# Patient Record
Sex: Male | Born: 1945 | Race: White | Hispanic: No | Marital: Single | Smoking: Former smoker
Health system: Southern US, Community
[De-identification: ages and names within clinical notes are randomized; demographics above are authoritative.]

## PROBLEM LIST (undated history)

## (undated) DIAGNOSIS — J449 Chronic obstructive pulmonary disease, unspecified: Secondary | ICD-10-CM

## (undated) DIAGNOSIS — I10 Essential (primary) hypertension: Secondary | ICD-10-CM

## (undated) DIAGNOSIS — I509 Heart failure, unspecified: Secondary | ICD-10-CM

## (undated) HISTORY — PX: KIDNEY STONE SURGERY: SHX686

## (undated) HISTORY — DX: Heart failure, unspecified: I50.9

## (undated) HISTORY — DX: Essential (primary) hypertension: I10

## (undated) HISTORY — DX: Chronic obstructive pulmonary disease, unspecified: J44.9

## (undated) HISTORY — PX: BLADDER SURGERY: SHX569

---

## 2005-09-03 ENCOUNTER — Emergency Department: Payer: Self-pay | Admitting: Emergency Medicine

## 2010-06-21 ENCOUNTER — Ambulatory Visit: Payer: Self-pay | Admitting: Specialist

## 2010-08-18 ENCOUNTER — Inpatient Hospital Stay: Payer: Self-pay | Admitting: Internal Medicine

## 2010-08-28 ENCOUNTER — Emergency Department: Payer: Self-pay | Admitting: Emergency Medicine

## 2010-09-20 ENCOUNTER — Ambulatory Visit: Payer: Self-pay | Admitting: Internal Medicine

## 2011-01-17 ENCOUNTER — Ambulatory Visit: Payer: Self-pay | Admitting: Internal Medicine

## 2011-01-28 ENCOUNTER — Inpatient Hospital Stay: Payer: Self-pay | Admitting: Internal Medicine

## 2011-02-16 ENCOUNTER — Ambulatory Visit: Payer: Self-pay | Admitting: Internal Medicine

## 2011-02-20 ENCOUNTER — Ambulatory Visit: Payer: Self-pay | Admitting: Cardiothoracic Surgery

## 2011-02-20 DIAGNOSIS — T797XXA Traumatic subcutaneous emphysema, initial encounter: Secondary | ICD-10-CM | POA: Insufficient documentation

## 2011-03-19 ENCOUNTER — Ambulatory Visit: Payer: Self-pay | Admitting: Cardiothoracic Surgery

## 2011-05-19 ENCOUNTER — Ambulatory Visit: Payer: Self-pay | Admitting: Internal Medicine

## 2011-06-04 ENCOUNTER — Inpatient Hospital Stay: Payer: Self-pay | Admitting: Internal Medicine

## 2011-06-19 ENCOUNTER — Ambulatory Visit: Payer: Self-pay | Admitting: Internal Medicine

## 2011-08-04 ENCOUNTER — Inpatient Hospital Stay: Payer: Self-pay | Admitting: Internal Medicine

## 2011-08-20 ENCOUNTER — Emergency Department: Payer: Self-pay | Admitting: Emergency Medicine

## 2011-08-21 LAB — URINALYSIS, COMPLETE
Bilirubin,UR: NEGATIVE
Glucose,UR: NEGATIVE mg/dL (ref 0–75)
Hyaline Cast: 4
Ketone: NEGATIVE
Ph: 7 (ref 4.5–8.0)
RBC,UR: 29 /HPF (ref 0–5)
Specific Gravity: 1.013 (ref 1.003–1.030)
Squamous Epithelial: NONE SEEN
WBC UR: 347 /HPF (ref 0–5)

## 2011-08-21 LAB — COMPREHENSIVE METABOLIC PANEL
BUN: 31 mg/dL — ABNORMAL HIGH (ref 7–18)
Bilirubin,Total: 0.2 mg/dL (ref 0.2–1.0)
Calcium, Total: 8.9 mg/dL (ref 8.5–10.1)
Chloride: 98 mmol/L (ref 98–107)
Co2: 33 mmol/L — ABNORMAL HIGH (ref 21–32)
Creatinine: 1.05 mg/dL (ref 0.60–1.30)
EGFR (African American): >60
EGFR (Non-African Amer.): >60
SGOT(AST): 17 U/L (ref 15–37)
SGPT (ALT): 17 U/L

## 2011-08-21 LAB — CK TOTAL AND CKMB (NOT AT ARMC)
CK, Total: 33 U/L — ABNORMAL LOW (ref 35–232)
CK-MB: 1.1 ng/mL (ref 0.5–3.6)

## 2011-08-21 LAB — CBC WITH DIFFERENTIAL/PLATELET
Basophil %: 0.3 %
Eosinophil %: 1.5 %
HGB: 11.4 g/dL — ABNORMAL LOW (ref 13.0–18.0)
Lymphocyte %: 14.8 %
MCH: 30.1 pg (ref 26.0–34.0)
MCHC: 33.1 g/dL (ref 32.0–36.0)
MCV: 91 fL (ref 80–100)
Neutrophil %: 75.5 %
Platelet: 314 10*3/uL (ref 150–440)

## 2011-08-21 LAB — TROPONIN I: Troponin-I: <0.02 ng/mL

## 2011-11-27 LAB — CBC
HCT: 30.4 % — ABNORMAL LOW (ref 40.0–52.0)
HGB: 9.7 g/dL — ABNORMAL LOW (ref 13.0–18.0)
MCH: 28 pg (ref 26.0–34.0)
MCHC: 31.9 g/dL — ABNORMAL LOW (ref 32.0–36.0)
MCV: 88 fL (ref 80–100)
RBC: 3.46 10*6/uL — ABNORMAL LOW (ref 4.40–5.90)

## 2011-11-27 LAB — COMPREHENSIVE METABOLIC PANEL
Albumin: 2.9 g/dL — ABNORMAL LOW (ref 3.4–5.0)
BUN: 63 mg/dL — ABNORMAL HIGH (ref 7–18)
Calcium, Total: 8.8 mg/dL (ref 8.5–10.1)
Chloride: 101 mmol/L (ref 98–107)
Co2: 29 mmol/L (ref 21–32)
EGFR (African American): 24 — ABNORMAL LOW
Osmolality: 299 (ref 275–301)
Potassium: 3.7 mmol/L (ref 3.5–5.1)
SGOT(AST): 21 U/L (ref 15–37)
SGPT (ALT): 17 U/L
Sodium: 139 mmol/L (ref 136–145)

## 2011-11-27 LAB — TROPONIN I: Troponin-I: <0.02 ng/mL

## 2011-11-28 ENCOUNTER — Inpatient Hospital Stay: Payer: Self-pay | Admitting: Specialist

## 2011-11-28 LAB — BASIC METABOLIC PANEL
Anion Gap: 9 (ref 7–16)
BUN: 64 mg/dL — ABNORMAL HIGH (ref 7–18)
Co2: 29 mmol/L (ref 21–32)
Creatinine: 2.97 mg/dL — ABNORMAL HIGH (ref 0.60–1.30)
EGFR (African American): 24 — ABNORMAL LOW
Osmolality: 300 (ref 275–301)
Sodium: 140 mmol/L (ref 136–145)

## 2011-11-28 LAB — URINALYSIS, COMPLETE
Bilirubin,UR: NEGATIVE
Glucose,UR: NEGATIVE mg/dL (ref 0–75)
Ph: 5 (ref 4.5–8.0)
Protein: 30
WBC UR: 174 /HPF (ref 0–5)

## 2011-11-28 LAB — CBC WITH DIFFERENTIAL/PLATELET
Bands: 5 %
Basophil %: 0.6 %
Eosinophil #: 0 10*3/uL (ref 0.0–0.7)
Lymphocytes: 8 %
MCH: 27.3 pg (ref 26.0–34.0)
MCHC: 30.7 g/dL — ABNORMAL LOW (ref 32.0–36.0)
Monocyte %: 9 %
Monocytes: 4 %
Platelet: 261 10*3/uL (ref 150–440)
WBC: 24.4 10*3/uL — ABNORMAL HIGH (ref 3.8–10.6)

## 2011-11-29 ENCOUNTER — Ambulatory Visit: Payer: Self-pay | Admitting: Urology

## 2011-11-29 LAB — CBC WITH DIFFERENTIAL/PLATELET
Basophil #: 0 10*3/uL (ref 0.0–0.1)
Eosinophil %: 1 %
HGB: 8.5 g/dL — ABNORMAL LOW (ref 13.0–18.0)
Lymphocyte #: 1.4 10*3/uL (ref 1.0–3.6)
Lymphocyte %: 8.3 %
MCHC: 31.6 g/dL — ABNORMAL LOW (ref 32.0–36.0)
Neutrophil %: 79.5 %
Platelet: 255 10*3/uL (ref 150–440)

## 2011-11-29 LAB — BASIC METABOLIC PANEL
Chloride: 106 mmol/L (ref 98–107)
Creatinine: 2.77 mg/dL — ABNORMAL HIGH (ref 0.60–1.30)
EGFR (African American): 26 — ABNORMAL LOW
Glucose: 109 mg/dL — ABNORMAL HIGH (ref 65–99)
Potassium: 3.7 mmol/L (ref 3.5–5.1)
Sodium: 144 mmol/L (ref 136–145)

## 2011-11-29 LAB — URINE CULTURE

## 2011-11-30 LAB — BASIC METABOLIC PANEL
Anion Gap: 10 (ref 7–16)
BUN: 38 mg/dL — ABNORMAL HIGH (ref 7–18)
Chloride: 106 mmol/L (ref 98–107)
Creatinine: 1.93 mg/dL — ABNORMAL HIGH (ref 0.60–1.30)
EGFR (African American): 41 — ABNORMAL LOW
Sodium: 143 mmol/L (ref 136–145)

## 2011-11-30 LAB — CBC WITH DIFFERENTIAL/PLATELET
Basophil #: 0 10*3/uL (ref 0.0–0.1)
Basophil %: 0.3 %
Eosinophil #: 0.3 10*3/uL (ref 0.0–0.7)
Eosinophil %: 2.2 %
HGB: 8.8 g/dL — ABNORMAL LOW (ref 13.0–18.0)
Lymphocyte #: 1.5 10*3/uL (ref 1.0–3.6)
Lymphocyte %: 11.7 %
MCHC: 32.5 g/dL (ref 32.0–36.0)
MCV: 89 fL (ref 80–100)
Monocyte #: 1.3 x10 3/mm — ABNORMAL HIGH (ref 0.2–1.0)
Neutrophil #: 9.6 10*3/uL — ABNORMAL HIGH (ref 1.4–6.5)
Neutrophil %: 75.3 %
Platelet: 287 10*3/uL (ref 150–440)
RBC: 3.03 10*6/uL — ABNORMAL LOW (ref 4.40–5.90)
RDW: 14.1 % (ref 11.5–14.5)
WBC: 12.8 10*3/uL — ABNORMAL HIGH (ref 3.8–10.6)

## 2011-12-01 LAB — CBC WITH DIFFERENTIAL/PLATELET
Basophil #: 0.1 10*3/uL (ref 0.0–0.1)
Eosinophil #: 0.3 10*3/uL (ref 0.0–0.7)
Eosinophil %: 1.8 %
HGB: 9.6 g/dL — ABNORMAL LOW (ref 13.0–18.0)
Lymphocyte %: 13.1 %
MCH: 27.9 pg (ref 26.0–34.0)
MCHC: 31.6 g/dL — ABNORMAL LOW (ref 32.0–36.0)
MCV: 88 fL (ref 80–100)
Neutrophil #: 10.6 10*3/uL — ABNORMAL HIGH (ref 1.4–6.5)
Neutrophil %: 74.5 %
Platelet: 350 10*3/uL (ref 150–440)
RBC: 3.43 10*6/uL — ABNORMAL LOW (ref 4.40–5.90)
WBC: 14.3 10*3/uL — ABNORMAL HIGH (ref 3.8–10.6)

## 2011-12-01 LAB — BASIC METABOLIC PANEL
BUN: 28 mg/dL — ABNORMAL HIGH (ref 7–18)
Chloride: 105 mmol/L (ref 98–107)
Co2: 29 mmol/L (ref 21–32)
Creatinine: 1.55 mg/dL — ABNORMAL HIGH (ref 0.60–1.30)
EGFR (African American): 53 — ABNORMAL LOW
EGFR (Non-African Amer.): 46 — ABNORMAL LOW
Osmolality: 291 (ref 275–301)
Potassium: 3.8 mmol/L (ref 3.5–5.1)
Sodium: 143 mmol/L (ref 136–145)

## 2011-12-02 LAB — BASIC METABOLIC PANEL
Anion Gap: 8 (ref 7–16)
BUN: 21 mg/dL — ABNORMAL HIGH (ref 7–18)
Chloride: 105 mmol/L (ref 98–107)
EGFR (Non-African Amer.): 60 — ABNORMAL LOW
Glucose: 120 mg/dL — ABNORMAL HIGH (ref 65–99)
Osmolality: 285 (ref 275–301)
Sodium: 141 mmol/L (ref 136–145)

## 2012-01-06 ENCOUNTER — Inpatient Hospital Stay: Payer: Self-pay | Admitting: Urology

## 2012-01-06 DIAGNOSIS — I4891 Unspecified atrial fibrillation: Secondary | ICD-10-CM

## 2012-01-07 LAB — CBC WITH DIFFERENTIAL/PLATELET
Basophil %: 0.4 %
Eosinophil #: 0.4 10*3/uL (ref 0.0–0.7)
Eosinophil %: 3.2 %
HGB: 7.9 g/dL — ABNORMAL LOW (ref 13.0–18.0)
Lymphocyte %: 22.4 %
MCHC: 32.4 g/dL (ref 32.0–36.0)
MCV: 86 fL (ref 80–100)
Monocyte #: 1 x10 3/mm (ref 0.2–1.0)
Monocyte %: 8.6 %
Neutrophil #: 7.9 10*3/uL — ABNORMAL HIGH (ref 1.4–6.5)
Platelet: 465 10*3/uL — ABNORMAL HIGH (ref 150–440)
RBC: 2.82 10*6/uL — ABNORMAL LOW (ref 4.40–5.90)
RDW: 14.6 % — ABNORMAL HIGH (ref 11.5–14.5)
WBC: 12 10*3/uL — ABNORMAL HIGH (ref 3.8–10.6)

## 2012-01-07 LAB — BASIC METABOLIC PANEL
Anion Gap: 7 (ref 7–16)
BUN: 15 mg/dL (ref 7–18)
Calcium, Total: 8.2 mg/dL — ABNORMAL LOW (ref 8.5–10.1)
Chloride: 104 mmol/L (ref 98–107)
Co2: 29 mmol/L (ref 21–32)
Creatinine: 1.02 mg/dL (ref 0.60–1.30)
EGFR (Non-African Amer.): >60
Glucose: 104 mg/dL — ABNORMAL HIGH (ref 65–99)
Sodium: 140 mmol/L (ref 136–145)

## 2012-01-09 LAB — PATHOLOGY REPORT

## 2012-01-14 ENCOUNTER — Ambulatory Visit: Payer: Self-pay | Admitting: Oncology

## 2012-01-15 ENCOUNTER — Ambulatory Visit: Payer: Self-pay | Admitting: Oncology

## 2012-01-15 LAB — CBC CANCER CENTER
Basophil #: 0 x10 3/mm (ref 0.0–0.1)
Eosinophil %: 3.6 %
HCT: 28.5 % — ABNORMAL LOW (ref 40.0–52.0)
HGB: 9 g/dL — ABNORMAL LOW (ref 13.0–18.0)
Lymphocyte #: 2.5 x10 3/mm (ref 1.0–3.6)
MCV: 88 fL (ref 80–100)
Monocyte #: 1.1 x10 3/mm — ABNORMAL HIGH (ref 0.2–1.0)
Monocyte %: 10.3 %
Neutrophil #: 6.4 x10 3/mm (ref 1.4–6.5)
Neutrophil %: 61.2 %
Platelet: 498 x10 3/mm — ABNORMAL HIGH (ref 150–440)
RBC: 3.26 10*6/uL — ABNORMAL LOW (ref 4.40–5.90)
RDW: 15.7 % — ABNORMAL HIGH (ref 11.5–14.5)
WBC: 10.4 x10 3/mm (ref 3.8–10.6)

## 2012-01-15 LAB — COMPREHENSIVE METABOLIC PANEL
Albumin: 3.1 g/dL — ABNORMAL LOW (ref 3.4–5.0)
Alkaline Phosphatase: 69 U/L (ref 50–136)
Anion Gap: 5 — ABNORMAL LOW (ref 7–16)
BUN: 17 mg/dL (ref 7–18)
Bilirubin,Total: 0.1 mg/dL — ABNORMAL LOW (ref 0.2–1.0)
Calcium, Total: 8.8 mg/dL (ref 8.5–10.1)
Chloride: 100 mmol/L (ref 98–107)
EGFR (African American): >60
EGFR (Non-African Amer.): >60
Glucose: 90 mg/dL (ref 65–99)
Osmolality: 279 (ref 275–301)
SGOT(AST): 19 U/L (ref 15–37)
SGPT (ALT): 20 U/L

## 2012-01-17 ENCOUNTER — Ambulatory Visit: Payer: Self-pay | Admitting: Oncology

## 2012-01-28 LAB — CBC CANCER CENTER
Basophil %: 0.5 %
Eosinophil: 4 %
HCT: 32.5 % — ABNORMAL LOW (ref 40.0–52.0)
HGB: 10.2 g/dL — ABNORMAL LOW (ref 13.0–18.0)
Lymphocyte %: 29.7 %
Monocyte #: 0.9 x10 3/mm (ref 0.2–1.0)
Monocyte %: 9.8 %
Neutrophil #: 4.8 x10 3/mm (ref 1.4–6.5)
Neutrophil %: 55.8 %
Platelet: 320 x10 3/mm (ref 150–440)
RBC: 3.63 10*6/uL — ABNORMAL LOW (ref 4.40–5.90)

## 2012-02-11 ENCOUNTER — Ambulatory Visit: Payer: Self-pay | Admitting: Oncology

## 2012-02-16 ENCOUNTER — Ambulatory Visit: Payer: Self-pay | Admitting: Oncology

## 2012-03-10 ENCOUNTER — Ambulatory Visit: Payer: Self-pay | Admitting: Gastroenterology

## 2012-03-12 LAB — PATHOLOGY REPORT

## 2012-03-15 LAB — CBC CANCER CENTER
Basophil %: 0.3 %
Eosinophil %: 2.8 %
HCT: 37 % — ABNORMAL LOW (ref 40.0–52.0)
HGB: 12.2 g/dL — ABNORMAL LOW (ref 13.0–18.0)
Lymphocyte %: 27.1 %
Monocyte %: 11 %
Neutrophil %: 58.8 %
Platelet: 286 x10 3/mm (ref 150–440)
RBC: 4.14 10*6/uL — ABNORMAL LOW (ref 4.40–5.90)
WBC: 7 x10 3/mm (ref 3.8–10.6)

## 2012-03-15 LAB — PROTIME-INR: Prothrombin Time: 12.7 secs (ref 11.5–14.7)

## 2012-03-16 ENCOUNTER — Ambulatory Visit: Payer: Self-pay | Admitting: Oncology

## 2012-03-18 ENCOUNTER — Ambulatory Visit: Payer: Self-pay | Admitting: Oncology

## 2012-04-18 ENCOUNTER — Ambulatory Visit: Payer: Self-pay | Admitting: Oncology

## 2012-04-22 ENCOUNTER — Ambulatory Visit: Payer: Self-pay | Admitting: Urology

## 2012-04-22 LAB — BASIC METABOLIC PANEL
Anion Gap: 1 — ABNORMAL LOW (ref 7–16)
BUN: 22 mg/dL — ABNORMAL HIGH (ref 7–18)
Chloride: 103 mmol/L (ref 98–107)
Creatinine: 1.02 mg/dL (ref 0.60–1.30)
EGFR (African American): >60
EGFR (Non-African Amer.): >60
Osmolality: 279 (ref 275–301)
Sodium: 138 mmol/L (ref 136–145)

## 2012-04-22 LAB — CBC WITH DIFFERENTIAL/PLATELET
Basophil #: 0 10*3/uL (ref 0.0–0.1)
Basophil %: 0.3 %
Eosinophil %: 2.9 %
HGB: 11.8 g/dL — ABNORMAL LOW (ref 13.0–18.0)
Lymphocyte %: 27.7 %
MCV: 87 fL (ref 80–100)
Monocyte #: 0.8 x10 3/mm (ref 0.2–1.0)
Monocyte %: 11.1 %
Neutrophil %: 58 %
Platelet: 262 10*3/uL (ref 150–440)
RBC: 4.08 10*6/uL — ABNORMAL LOW (ref 4.40–5.90)
RDW: 13.9 % (ref 11.5–14.5)
WBC: 6.8 10*3/uL (ref 3.8–10.6)

## 2012-05-03 DIAGNOSIS — D414 Neoplasm of uncertain behavior of bladder: Secondary | ICD-10-CM | POA: Insufficient documentation

## 2012-05-03 DIAGNOSIS — N1330 Unspecified hydronephrosis: Secondary | ICD-10-CM | POA: Insufficient documentation

## 2012-05-03 DIAGNOSIS — N401 Enlarged prostate with lower urinary tract symptoms: Secondary | ICD-10-CM | POA: Insufficient documentation

## 2012-05-03 DIAGNOSIS — N430 Encysted hydrocele: Secondary | ICD-10-CM | POA: Insufficient documentation

## 2012-05-03 DIAGNOSIS — R339 Retention of urine, unspecified: Secondary | ICD-10-CM | POA: Insufficient documentation

## 2012-05-03 DIAGNOSIS — N390 Urinary tract infection, site not specified: Secondary | ICD-10-CM | POA: Insufficient documentation

## 2012-05-03 DIAGNOSIS — N138 Other obstructive and reflux uropathy: Secondary | ICD-10-CM | POA: Insufficient documentation

## 2012-05-04 ENCOUNTER — Ambulatory Visit: Payer: Self-pay | Admitting: Urology

## 2012-05-19 DIAGNOSIS — K4090 Unilateral inguinal hernia, without obstruction or gangrene, not specified as recurrent: Secondary | ICD-10-CM | POA: Insufficient documentation

## 2012-08-24 DIAGNOSIS — C4490 Unspecified malignant neoplasm of skin, unspecified: Secondary | ICD-10-CM | POA: Insufficient documentation

## 2012-08-25 ENCOUNTER — Ambulatory Visit: Payer: Self-pay | Admitting: Ophthalmology

## 2012-09-18 ENCOUNTER — Ambulatory Visit: Payer: Self-pay | Admitting: Oncology

## 2012-09-21 DIAGNOSIS — C44101 Unspecified malignant neoplasm of skin of unspecified eyelid, including canthus: Secondary | ICD-10-CM | POA: Insufficient documentation

## 2012-10-07 ENCOUNTER — Inpatient Hospital Stay: Payer: Self-pay | Admitting: Urology

## 2012-10-07 LAB — URINALYSIS, COMPLETE
Nitrite: NEGATIVE
Ph: 7 (ref 4.5–8.0)
Squamous Epithelial: <1
WBC UR: 75 /HPF (ref 0–5)

## 2012-10-07 LAB — COMPREHENSIVE METABOLIC PANEL
Albumin: 3 g/dL — ABNORMAL LOW (ref 3.4–5.0)
Bilirubin,Total: 0.4 mg/dL (ref 0.2–1.0)
Co2: 26 mmol/L (ref 21–32)
Creatinine: 2.9 mg/dL — ABNORMAL HIGH (ref 0.60–1.30)
EGFR (African American): 25 — ABNORMAL LOW
Glucose: 135 mg/dL — ABNORMAL HIGH (ref 65–99)
Osmolality: 294 (ref 275–301)
Potassium: 4.1 mmol/L (ref 3.5–5.1)
SGOT(AST): 17 U/L (ref 15–37)

## 2012-10-07 LAB — CBC
HCT: 31.6 % — ABNORMAL LOW (ref 40.0–52.0)
HGB: 10.5 g/dL — ABNORMAL LOW (ref 13.0–18.0)
MCH: 30.1 pg (ref 26.0–34.0)
MCHC: 33.4 g/dL (ref 32.0–36.0)
RBC: 3.51 10*6/uL — ABNORMAL LOW (ref 4.40–5.90)
RDW: 14.1 % (ref 11.5–14.5)
WBC: 21 10*3/uL — ABNORMAL HIGH (ref 3.8–10.6)

## 2012-10-07 LAB — LIPASE, BLOOD: Lipase: 54 U/L — ABNORMAL LOW (ref 73–393)

## 2012-10-07 LAB — TROPONIN I: Troponin-I: <0.02 ng/mL

## 2012-10-14 ENCOUNTER — Ambulatory Visit: Payer: Self-pay | Admitting: Urology

## 2012-10-19 ENCOUNTER — Ambulatory Visit: Payer: Self-pay | Admitting: Urology

## 2012-10-19 DIAGNOSIS — N201 Calculus of ureter: Secondary | ICD-10-CM | POA: Insufficient documentation

## 2012-10-19 DIAGNOSIS — N200 Calculus of kidney: Secondary | ICD-10-CM | POA: Insufficient documentation

## 2012-10-27 ENCOUNTER — Ambulatory Visit: Payer: Self-pay | Admitting: Urology

## 2012-11-04 ENCOUNTER — Ambulatory Visit: Payer: Self-pay | Admitting: Urology

## 2012-11-16 ENCOUNTER — Ambulatory Visit: Payer: Self-pay | Admitting: Urology

## 2012-11-17 ENCOUNTER — Ambulatory Visit: Payer: Self-pay | Admitting: Urology

## 2012-11-17 DIAGNOSIS — N23 Unspecified renal colic: Secondary | ICD-10-CM | POA: Insufficient documentation

## 2012-12-14 ENCOUNTER — Ambulatory Visit: Payer: Self-pay | Admitting: Urology

## 2012-12-21 LAB — PROTIME-INR
INR: 0.9
Prothrombin Time: 12.5 secs (ref 11.5–14.7)

## 2012-12-22 LAB — BASIC METABOLIC PANEL
BUN: 17 mg/dL (ref 7–18)
Calcium, Total: 7.7 mg/dL — ABNORMAL LOW (ref 8.5–10.1)
Chloride: 109 mmol/L — ABNORMAL HIGH (ref 98–107)
Co2: 28 mmol/L (ref 21–32)
Creatinine: 1.32 mg/dL — ABNORMAL HIGH (ref 0.60–1.30)
EGFR (African American): >60
EGFR (Non-African Amer.): 55 — ABNORMAL LOW
Glucose: 118 mg/dL — ABNORMAL HIGH (ref 65–99)
Osmolality: 284 (ref 275–301)
Sodium: 141 mmol/L (ref 136–145)

## 2012-12-22 LAB — CBC WITH DIFFERENTIAL/PLATELET
Basophil #: 0 10*3/uL (ref 0.0–0.1)
Basophil %: 0.2 %
Eosinophil #: 0 10*3/uL (ref 0.0–0.7)
Eosinophil %: 0.5 %
HGB: 7.5 g/dL — ABNORMAL LOW (ref 13.0–18.0)
Lymphocyte #: 1.8 10*3/uL (ref 1.0–3.6)
Lymphocyte %: 22.6 %
MCH: 29.7 pg (ref 26.0–34.0)
MCHC: 33 g/dL (ref 32.0–36.0)
MCV: 90 fL (ref 80–100)
Monocyte #: 1 x10 3/mm (ref 0.2–1.0)
Neutrophil %: 63.8 %
Platelet: 225 10*3/uL (ref 150–440)
RBC: 2.51 10*6/uL — ABNORMAL LOW (ref 4.40–5.90)
RDW: 13.7 % (ref 11.5–14.5)
WBC: 7.9 10*3/uL (ref 3.8–10.6)

## 2012-12-22 LAB — MAGNESIUM: Magnesium: 1.3 mg/dL — ABNORMAL LOW

## 2012-12-23 ENCOUNTER — Inpatient Hospital Stay: Payer: Self-pay | Admitting: Urology

## 2012-12-23 LAB — CBC WITH DIFFERENTIAL/PLATELET
Basophil #: 0 x10 3/mm 3
Basophil %: 0.3 %
Eosinophil #: 0.2 x10 3/mm 3
Eosinophil %: 2.5 %
HCT: 20.6 % — ABNORMAL LOW
HGB: 7.1 g/dL — ABNORMAL LOW
Lymphocyte %: 20.9 %
Lymphs Abs: 2 x10 3/mm 3
MCH: 31.1 pg
MCHC: 34.3 g/dL
MCV: 91 fL
Monocyte #: 1 "x10 3/mm "
Monocyte %: 10.7 %
Neutrophil #: 6.3 x10 3/mm 3
Neutrophil %: 65.6 %
Platelet: 203 x10 3/mm 3
RBC: 2.28 x10 6/mm 3 — ABNORMAL LOW
RDW: 13.7 %
WBC: 9.6 x10 3/mm 3

## 2012-12-23 LAB — BASIC METABOLIC PANEL
Anion Gap: 5 — ABNORMAL LOW (ref 7–16)
BUN: 14 mg/dL (ref 7–18)
Calcium, Total: 7.7 mg/dL — ABNORMAL LOW (ref 8.5–10.1)
Chloride: 112 mmol/L — ABNORMAL HIGH (ref 98–107)
Co2: 28 mmol/L (ref 21–32)
Creatinine: 1.25 mg/dL (ref 0.60–1.30)
Osmolality: 289 (ref 275–301)
Potassium: 3.8 mmol/L (ref 3.5–5.1)
Sodium: 145 mmol/L (ref 136–145)

## 2012-12-23 LAB — MAGNESIUM: Magnesium: 1.3 mg/dL — ABNORMAL LOW

## 2012-12-24 LAB — CBC WITH DIFFERENTIAL/PLATELET
Basophil %: 0.4 %
Eosinophil #: 0.3 10*3/uL (ref 0.0–0.7)
Eosinophil %: 3.4 %
HCT: 25.6 % — ABNORMAL LOW (ref 40.0–52.0)
HGB: 8.7 g/dL — ABNORMAL LOW (ref 13.0–18.0)
Lymphocyte #: 2.2 10*3/uL (ref 1.0–3.6)
MCH: 29.8 pg (ref 26.0–34.0)
MCV: 87 fL (ref 80–100)
Monocyte #: 1.1 x10 3/mm — ABNORMAL HIGH (ref 0.2–1.0)
Neutrophil #: 5.5 10*3/uL (ref 1.4–6.5)
Neutrophil %: 60.3 %
Platelet: 213 10*3/uL (ref 150–440)
RBC: 2.93 10*6/uL — ABNORMAL LOW (ref 4.40–5.90)
RDW: 14.8 % — ABNORMAL HIGH (ref 11.5–14.5)
WBC: 9.1 10*3/uL (ref 3.8–10.6)

## 2012-12-29 IMAGING — CR DG CHEST 1V PORT
1 series · 1 of 1 positions shown · non-contrast
Comparison: none

REASON FOR EXAM: right chest tube.
COMMENTS:

PROCEDURE:     DXR - DXR PORTABLE CHEST SINGLE VIEW  - January 29, 2011  [DATE]
RESULT:     Comparison is made to a prior study dated 01/28/2011.

[view not recorded]
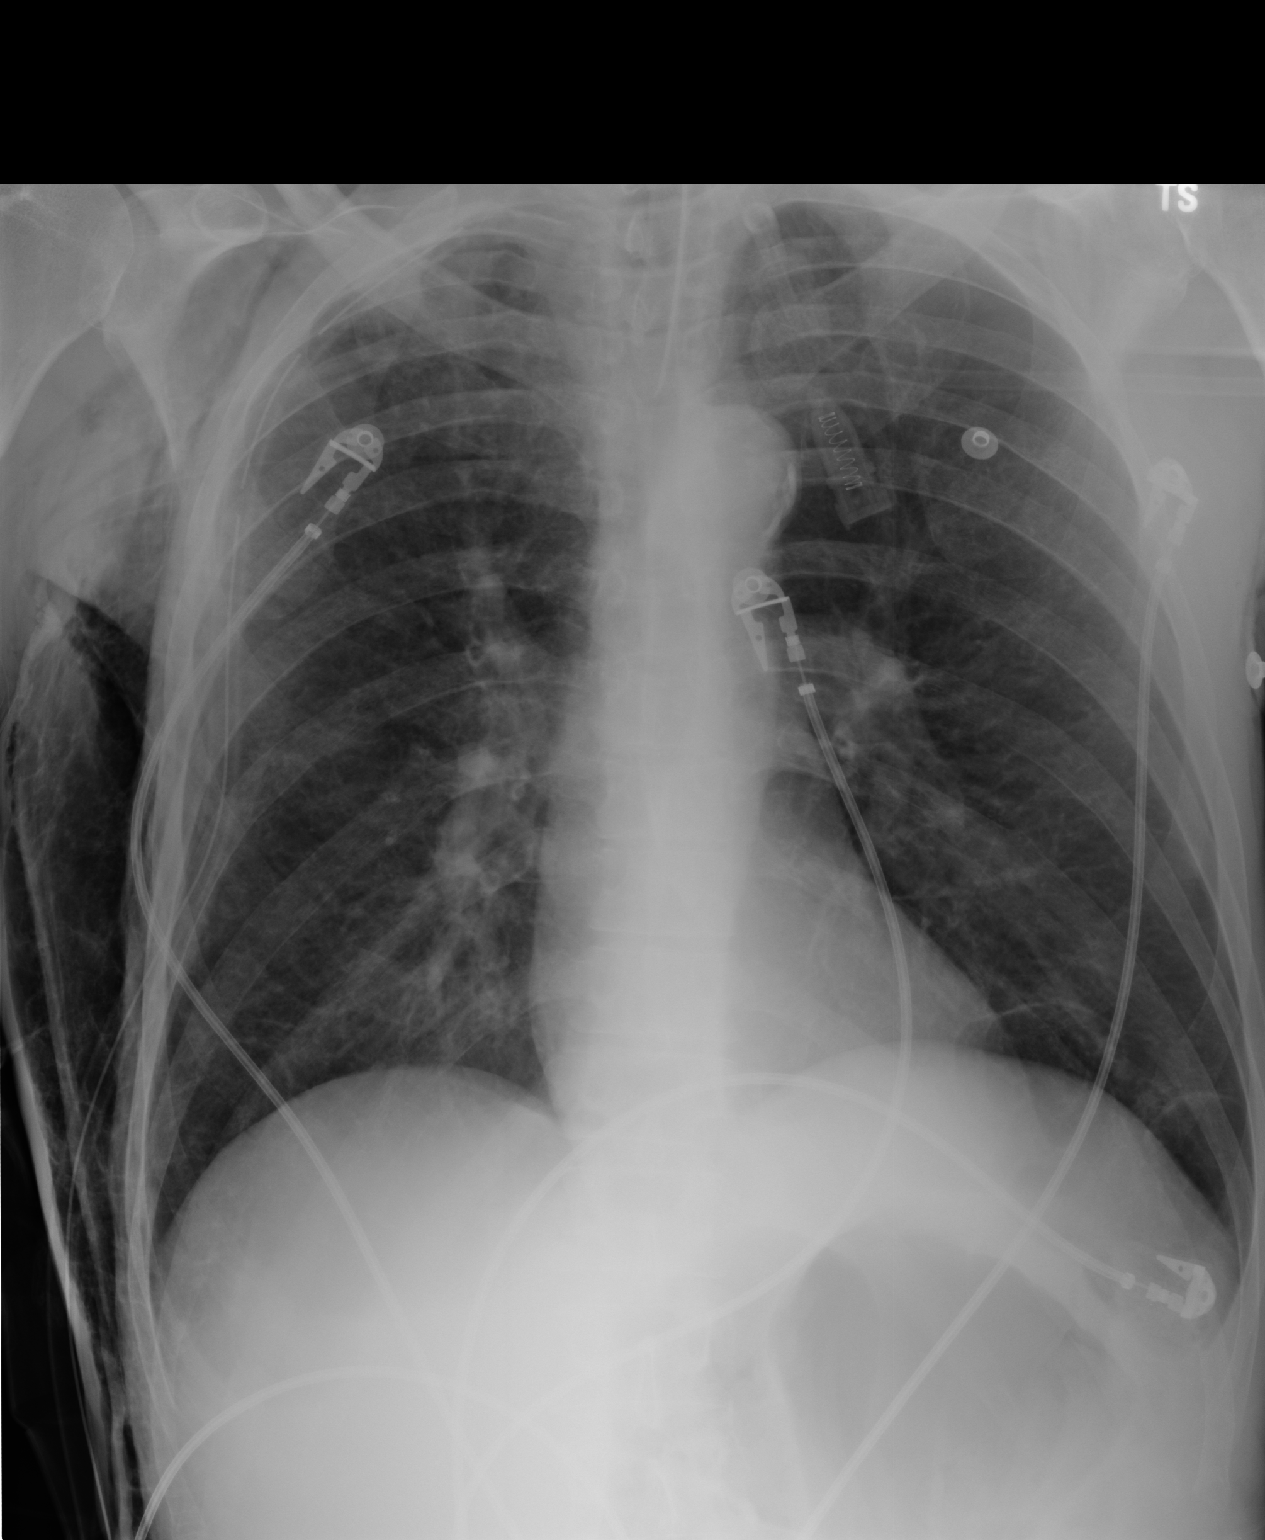

[1 of 1 positions shown; findings below may reference images not displayed]

FINDINGS: Endotracheal tube is appreciated with the tip at the level of
the clavicles. A right-sided chest tube is appreciated with the tip at the
level of the apex of the lung. There does not appear to be an appreciable
residual pneumothorax. No focal regions of consolidation or focal
infiltrates appreciated. There is mild prominence of the interstitial
markings. The cardiac silhouette and visualized skeleton are unremarkable.
IMPRESSION: Support tube as described above. There appears to be near complete
resolution of the previously described pneumothorax.

## 2012-12-30 IMAGING — CR DG CHEST 1V PORT
1 series · 1 of 1 positions shown · non-contrast
Comparison: none

REASON FOR EXAM: on vent
COMMENTS:

[view not recorded]
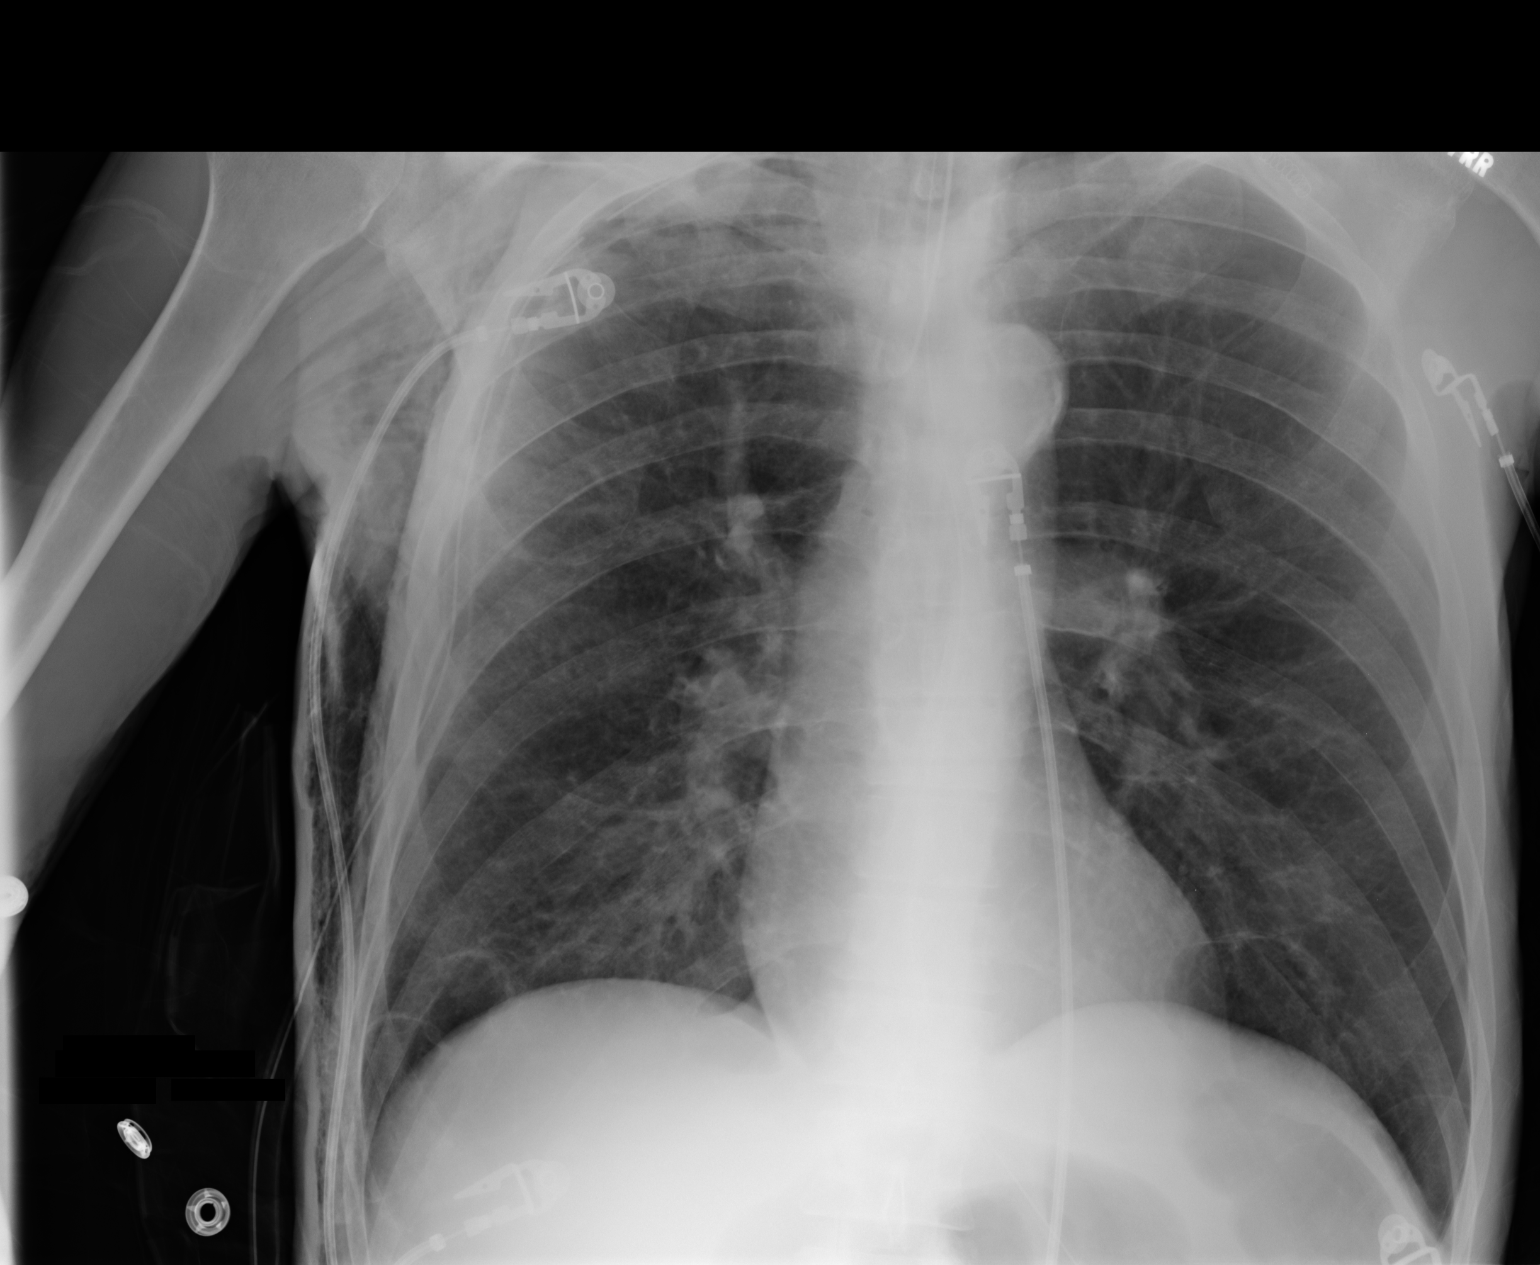

[1 of 1 positions shown; findings below may reference images not displayed]

PROCEDURE:     DXR - DXR PORTABLE CHEST SINGLE VIEW  - January 30, 2011  [DATE]

RESULT:     Comparison is made to the study of 01/29/2011. Right-sided chest
tube is present with the tip at the right lung apex. Minimal density is seen
at the right lung apex which could represent atelectasis. No gross
pneumothorax is present. There appear to be some areas of bullous change
near the right costophrenic angle. The lungs are otherwise clear. The heart
and pulmonary vessels are normal. An endotracheal tube is present with the
tip at the level of the aortic arch.
IMPRESSION: 1.     Right-sided chest tube remains present.
2.     No definite pneumothorax.
3.     Other findings as discussed above.

## 2013-01-03 IMAGING — CR DG CHEST 1V PORT
1 series · 1 of 1 positions shown · non-contrast
Comparison: none

REASON FOR EXAM: chest tube to water seal
COMMENTS:

[view not recorded]
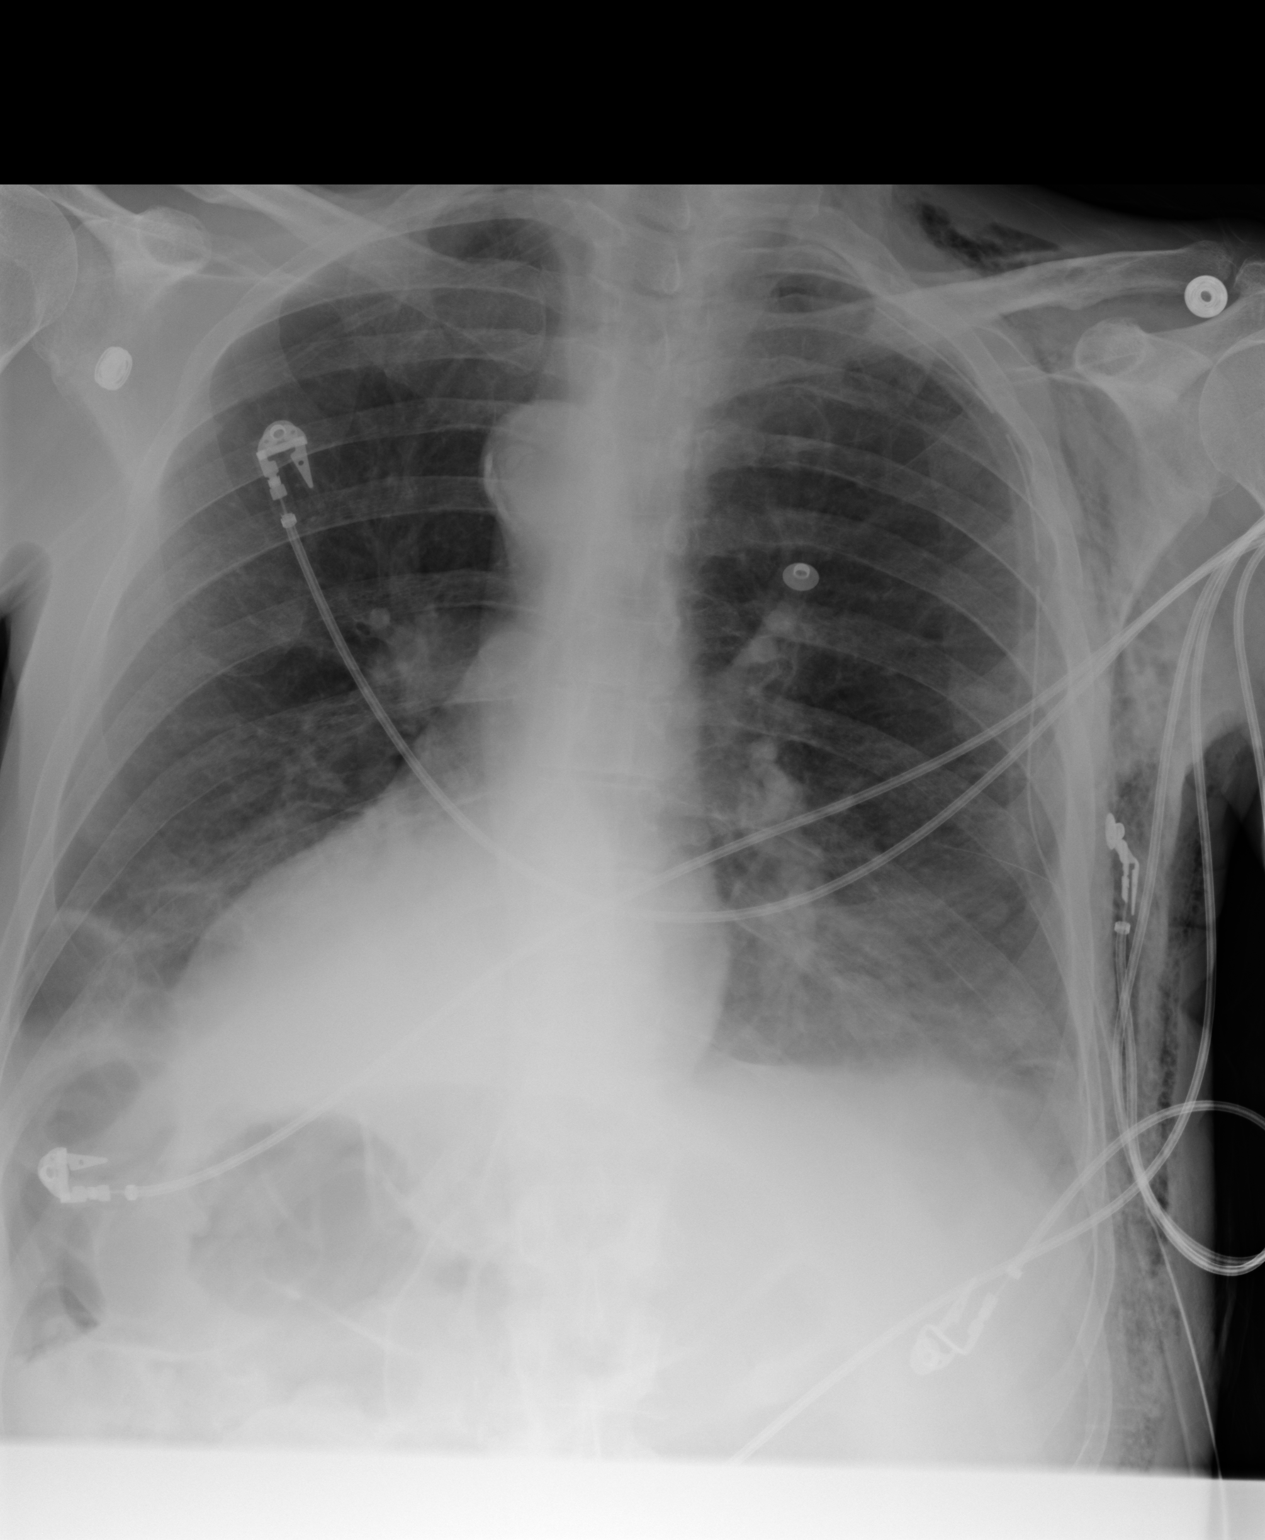

[1 of 1 positions shown; findings below may reference images not displayed]

PROCEDURE:     DXR - DXR PORTABLE CHEST SINGLE VIEW  - February 03, 2011  [DATE]

RESULT:     Comparison is made to a prior exam of earlier this date. A right
chest tube remains present. Subcutaneous emphysema on the right is again
noted. There is a hazy increase in density at both lung bases consistent
with bibasilar atelectasis. No acute changes of the heart or pulmonary
vasculature are seen.
IMPRESSION: 1. A right chest tube is present.
2. No pneumothorax is identified.
3. Moderately prominent subcutaneous emphysema is again noted on the right.
4. There is increased density at both lung bases consistent with bibasilar
atelectasis.

## 2013-01-06 IMAGING — CR DG CHEST 1V PORT
1 series · 1 of 1 positions shown · non-contrast
Comparison: none

REASON FOR EXAM: pneumothorax
COMMENTS:

[view not recorded]
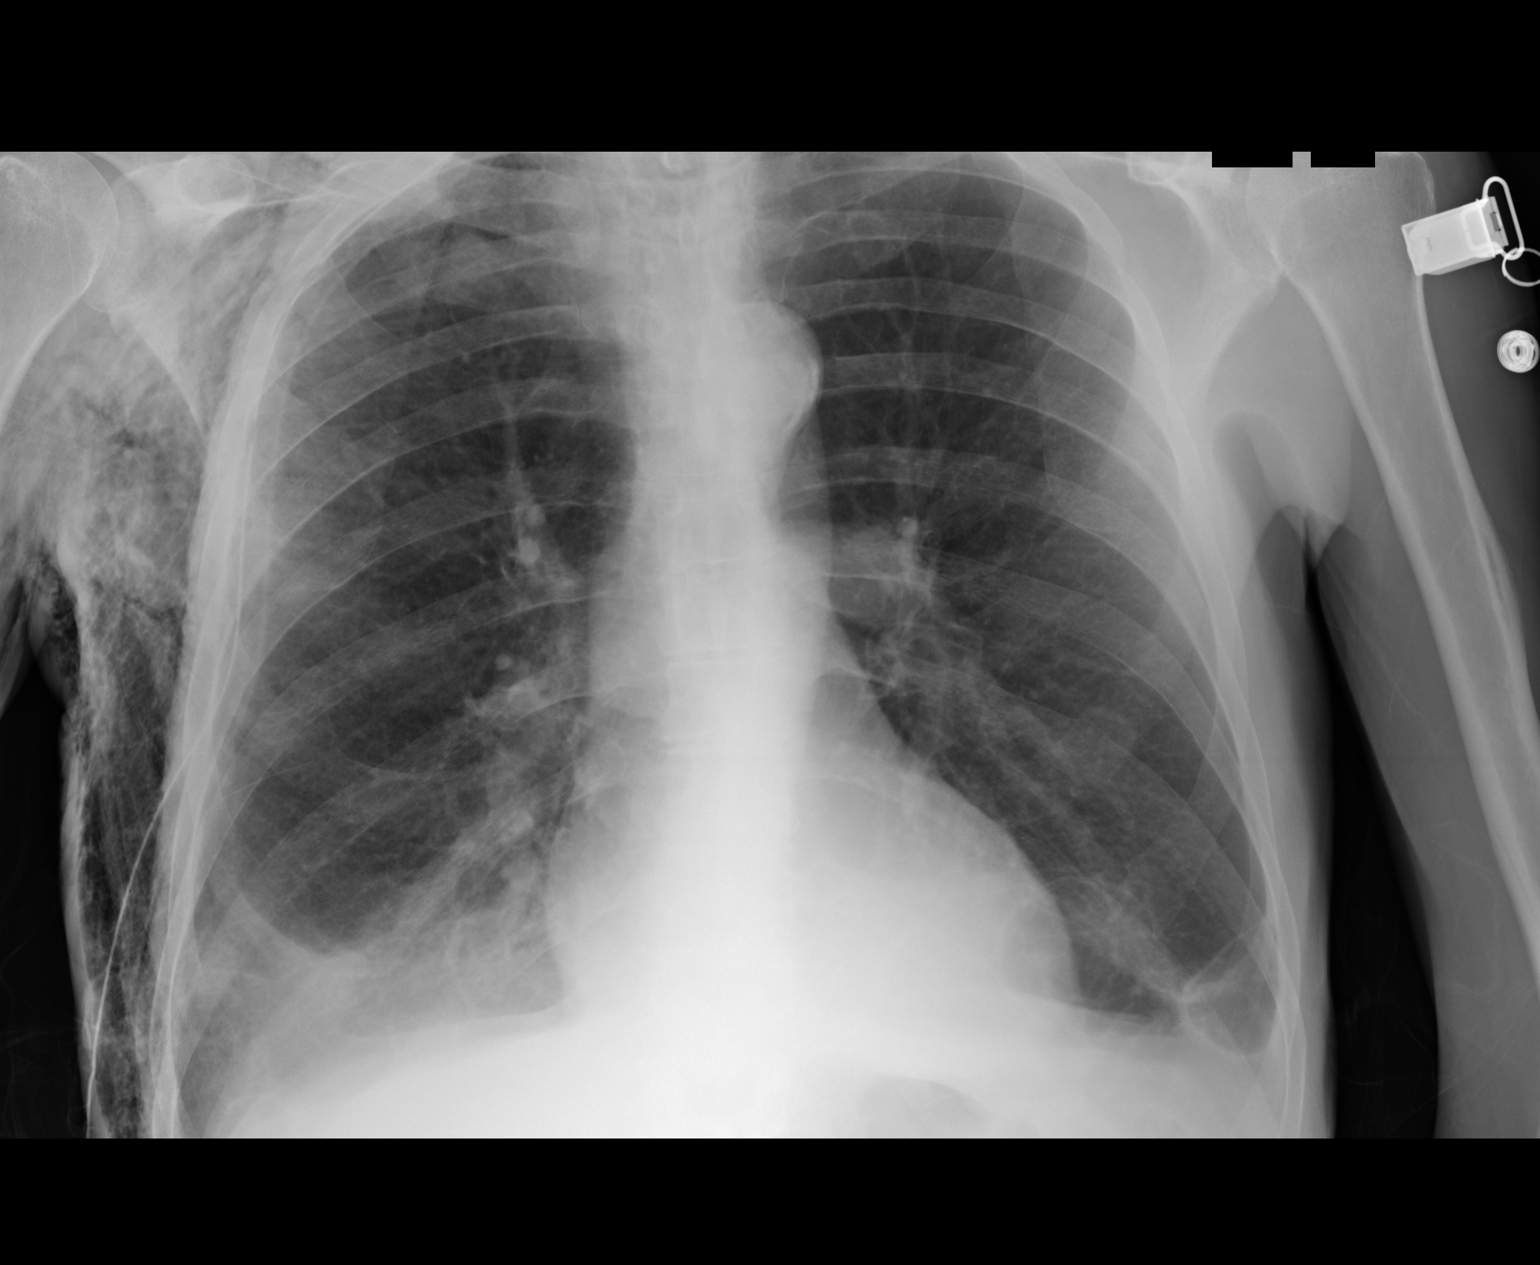

[1 of 1 positions shown; findings below may reference images not displayed]

PROCEDURE:     DXR - DXR PORTABLE CHEST SINGLE VIEW  - February 06, 2011 [DATE]

RESULT:     Comparison is made to a prior exam of 02/03/2011.

A right chest tube remains present. No pneumothorax is identified. There is
haziness at the right base compatible with atelectasis and possibly a small
effusion. There is also noted a trace of fluid at the left costophrenic
angle. There is a lucency present just above the left costophrenic angle
compatible with a bullous. The heart size is normal. No acute bony
abnormalities are seen. Extensive subcutaneous emphysema is again noted on
the right with extension into the cervical area bilaterally.
IMPRESSION: 1.  There persists extensive subcutaneous emphysema.
2.  A right chest tube remains present.
3.  No pneumothorax is identified.
4.  There is atelectasis at the lung bases bilaterally with possible trace
effusions.
3.  There is a small bullous at the left costophrenic angle.
4.  The heart size is normal.

## 2013-04-07 ENCOUNTER — Emergency Department: Payer: Self-pay | Admitting: Internal Medicine

## 2013-04-07 LAB — CBC
MCH: 29.3 pg (ref 26.0–34.0)
Platelet: 227 10*3/uL (ref 150–440)
RBC: 4.14 10*6/uL — ABNORMAL LOW (ref 4.40–5.90)
RDW: 15.7 % — ABNORMAL HIGH (ref 11.5–14.5)
WBC: 7.3 10*3/uL (ref 3.8–10.6)

## 2013-04-07 LAB — COMPREHENSIVE METABOLIC PANEL
Anion Gap: 5 — ABNORMAL LOW (ref 7–16)
Chloride: 104 mmol/L (ref 98–107)
Creatinine: 1.03 mg/dL (ref 0.60–1.30)
EGFR (African American): >60
Glucose: 98 mg/dL (ref 65–99)
Osmolality: 281 (ref 275–301)
SGPT (ALT): 21 U/L (ref 12–78)

## 2013-04-07 LAB — CARBAMAZEPINE LEVEL, TOTAL: Carbamazepine: 7.5 ug/mL (ref 4.0–12.0)

## 2013-04-22 ENCOUNTER — Ambulatory Visit: Payer: Self-pay | Admitting: Urology

## 2013-04-26 ENCOUNTER — Emergency Department: Payer: Self-pay | Admitting: Emergency Medicine

## 2013-04-26 LAB — CBC
HCT: 36.9 % — ABNORMAL LOW (ref 40.0–52.0)
HGB: 12.3 g/dL — ABNORMAL LOW (ref 13.0–18.0)
MCH: 29.9 pg (ref 26.0–34.0)
MCV: 89 fL (ref 80–100)
Platelet: 253 10*3/uL (ref 150–440)
RBC: 4.13 10*6/uL — ABNORMAL LOW (ref 4.40–5.90)
RDW: 14.6 % — ABNORMAL HIGH (ref 11.5–14.5)
WBC: 7.5 10*3/uL (ref 3.8–10.6)

## 2013-04-26 LAB — URINALYSIS, COMPLETE
Bilirubin,UR: NEGATIVE
Blood: NEGATIVE
Ketone: NEGATIVE
Nitrite: NEGATIVE
Ph: 7 (ref 4.5–8.0)
RBC,UR: 1 /HPF (ref 0–5)
Specific Gravity: 1.005 (ref 1.003–1.030)
Squamous Epithelial: NONE SEEN
WBC UR: 7 /HPF (ref 0–5)

## 2013-04-26 LAB — COMPREHENSIVE METABOLIC PANEL
Albumin: 3.6 g/dL (ref 3.4–5.0)
BUN: 15 mg/dL (ref 7–18)
Bilirubin,Total: 0.1 mg/dL — ABNORMAL LOW (ref 0.2–1.0)
Creatinine: 0.99 mg/dL (ref 0.60–1.30)
EGFR (African American): >60
EGFR (Non-African Amer.): >60
Glucose: 104 mg/dL — ABNORMAL HIGH (ref 65–99)
Osmolality: 282 (ref 275–301)
SGPT (ALT): 26 U/L (ref 12–78)
Sodium: 141 mmol/L (ref 136–145)
Total Protein: 7.6 g/dL (ref 6.4–8.2)

## 2013-12-14 ENCOUNTER — Ambulatory Visit: Payer: Self-pay | Admitting: Urology

## 2014-01-04 DIAGNOSIS — R0902 Hypoxemia: Secondary | ICD-10-CM | POA: Insufficient documentation

## 2014-01-04 DIAGNOSIS — J449 Chronic obstructive pulmonary disease, unspecified: Secondary | ICD-10-CM | POA: Insufficient documentation

## 2014-01-25 DIAGNOSIS — I2510 Atherosclerotic heart disease of native coronary artery without angina pectoris: Secondary | ICD-10-CM | POA: Insufficient documentation

## 2014-01-25 DIAGNOSIS — K219 Gastro-esophageal reflux disease without esophagitis: Secondary | ICD-10-CM | POA: Insufficient documentation

## 2014-01-25 DIAGNOSIS — I10 Essential (primary) hypertension: Secondary | ICD-10-CM | POA: Insufficient documentation

## 2014-01-25 DIAGNOSIS — I739 Peripheral vascular disease, unspecified: Secondary | ICD-10-CM | POA: Insufficient documentation

## 2014-01-25 DIAGNOSIS — R0602 Shortness of breath: Secondary | ICD-10-CM | POA: Insufficient documentation

## 2014-01-25 DIAGNOSIS — I509 Heart failure, unspecified: Secondary | ICD-10-CM | POA: Insufficient documentation

## 2014-01-25 DIAGNOSIS — I429 Cardiomyopathy, unspecified: Secondary | ICD-10-CM | POA: Insufficient documentation

## 2014-01-25 DIAGNOSIS — J40 Bronchitis, not specified as acute or chronic: Secondary | ICD-10-CM | POA: Insufficient documentation

## 2014-01-25 DIAGNOSIS — D649 Anemia, unspecified: Secondary | ICD-10-CM | POA: Insufficient documentation

## 2014-05-24 ENCOUNTER — Emergency Department: Payer: Self-pay | Admitting: Emergency Medicine

## 2014-09-07 IMAGING — CT CT ABD-PELV W/O CM
1 of 2 series · 14 of 32 positions shown, 18 images · non-contrast
Comparison: none

REASON FOR EXAM: (1) abdominal pain and fever; (2) abdominal pain and
fever
COMMENTS:

PROCEDURE:     CT  - CT ABDOMEN AND PELVIS W[DATE]  [DATE]
RESULT:
TECHNIQUE: Noncontrast CT of the abdomen and pelvis is performed and
reconstructed at 3 mm slice thickness in the axial plane.
There is no previous CT of the abdomen and pelvis for comparison.

[Series 2: 3mm soft tissue · axial · 0.71mm/px · z∈[-454,-76]mm · 14 of 138 slices shown, 18 images]
[im 6/138  soft-tissue]
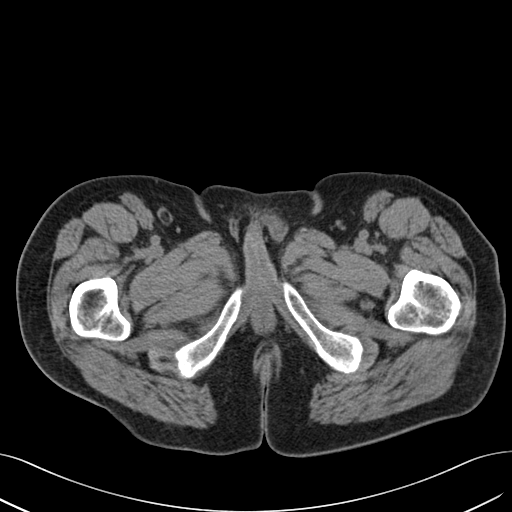
[im 6/138  bone]
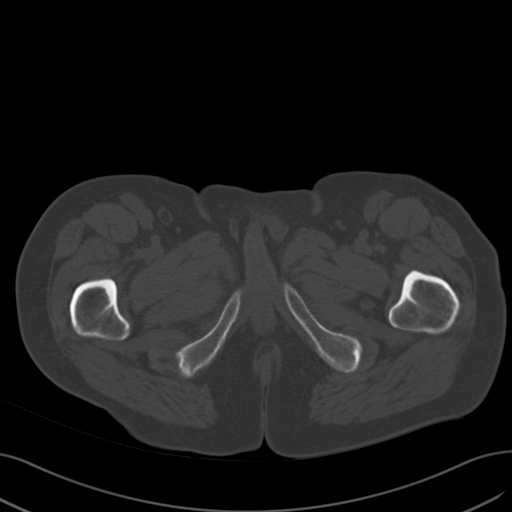
[im 18/138  soft-tissue]
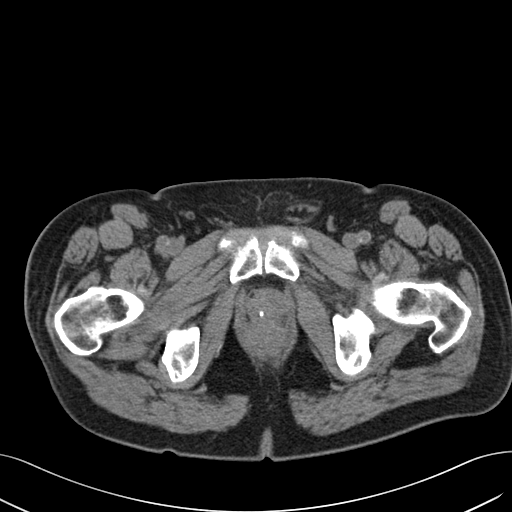
[im 29/138  soft-tissue]
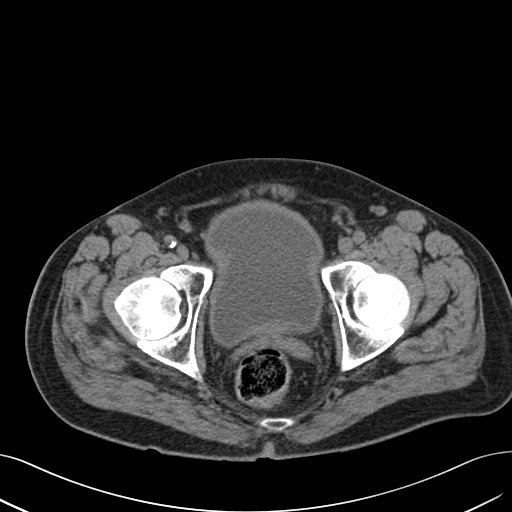
[im 40/138  soft-tissue]
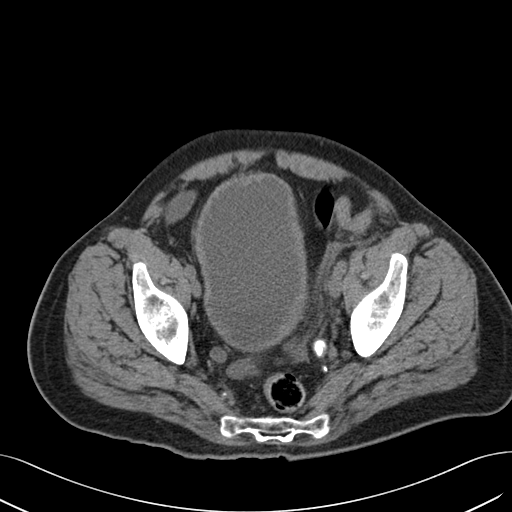
[im 52/138  soft-tissue]
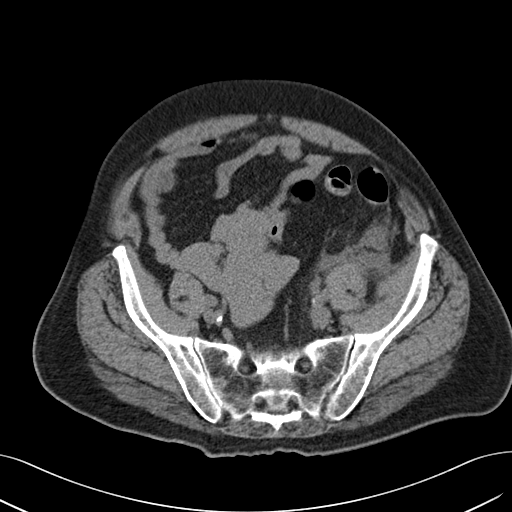
[im 63/138  soft-tissue]
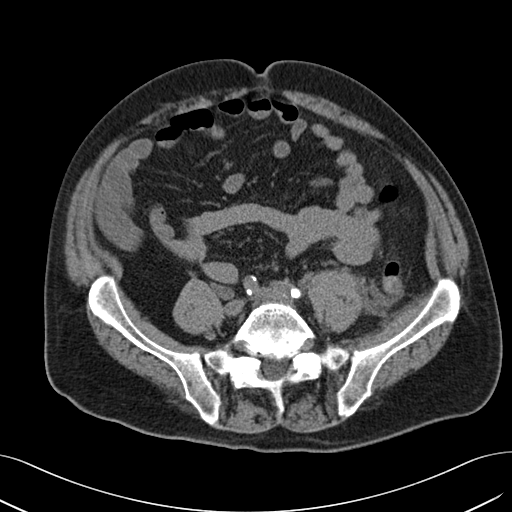
[im 75/138  soft-tissue]
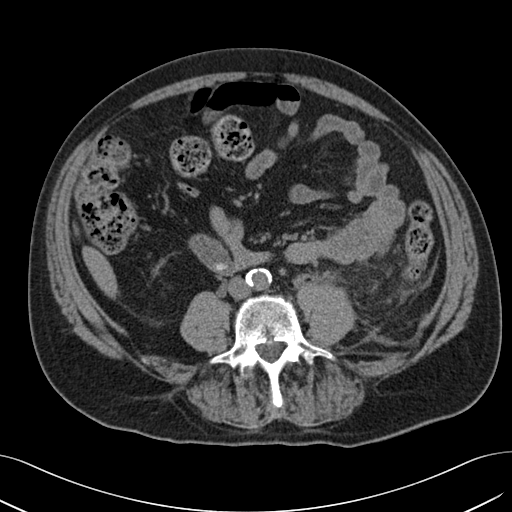
[im 86/138  soft-tissue]
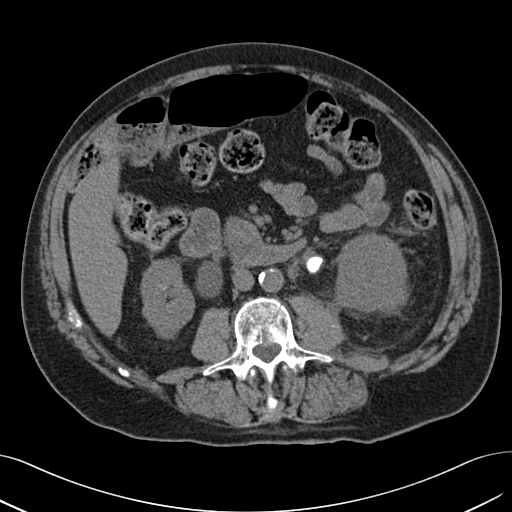
[im 98/138  soft-tissue]
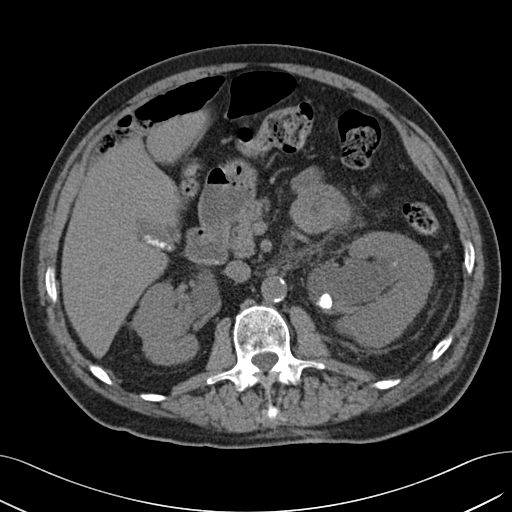
[im 98/138  bone]
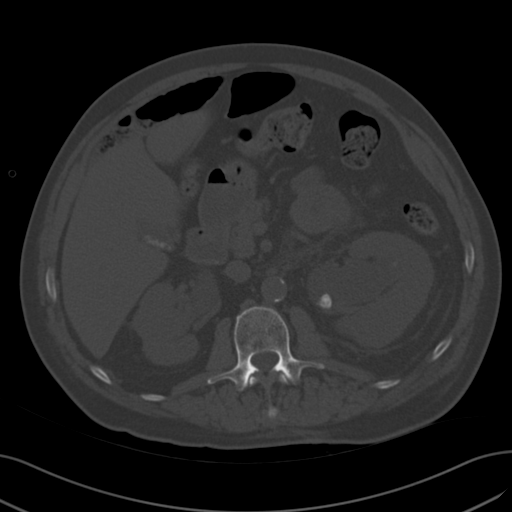
[im 109/138  soft-tissue]
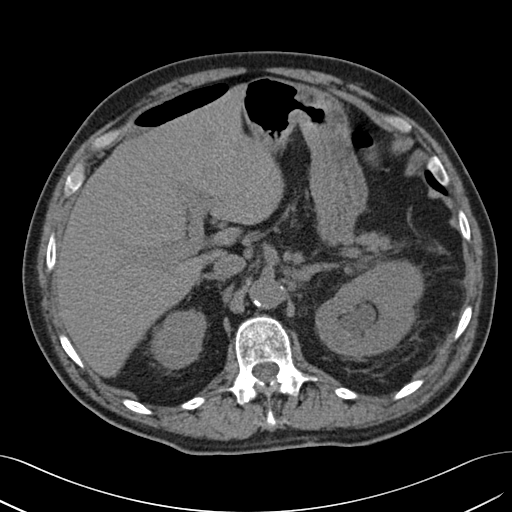
[im 115/138  lung]
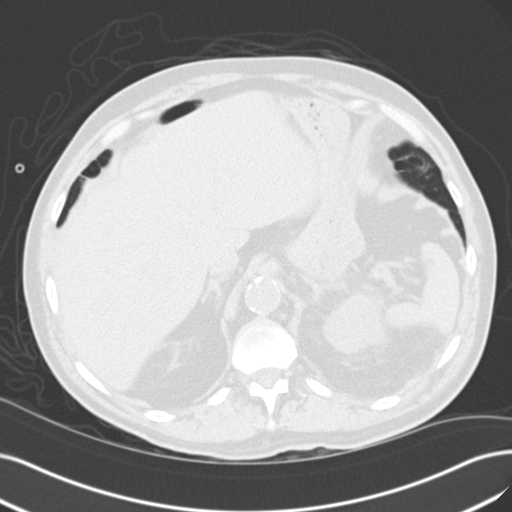
[im 120/138  soft-tissue]
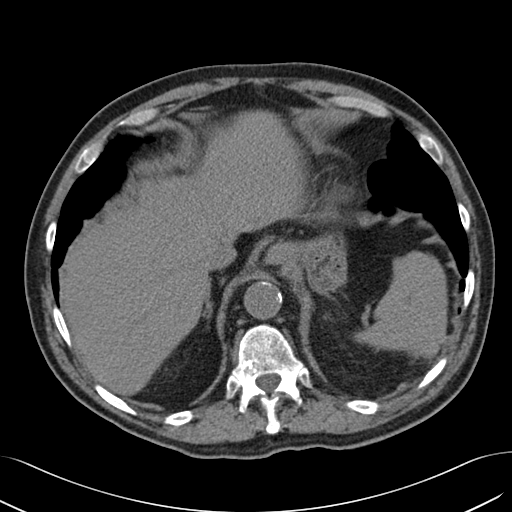
[im 120/138  lung]
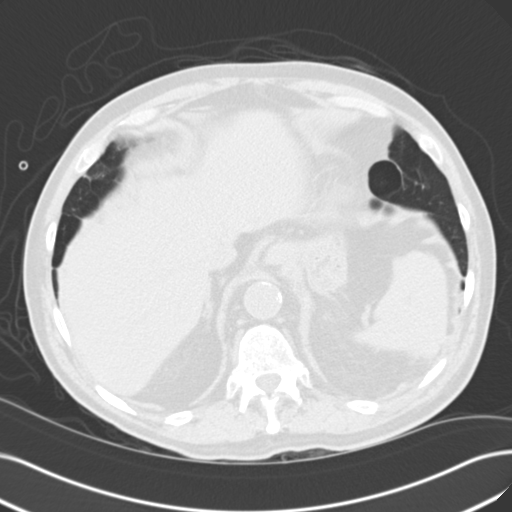
[im 126/138  lung]
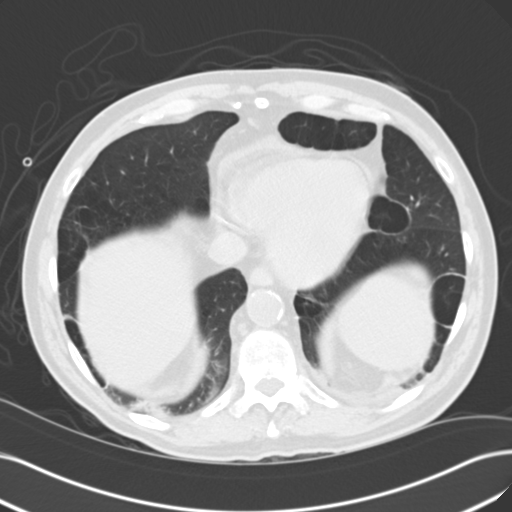
[im 132/138  soft-tissue]
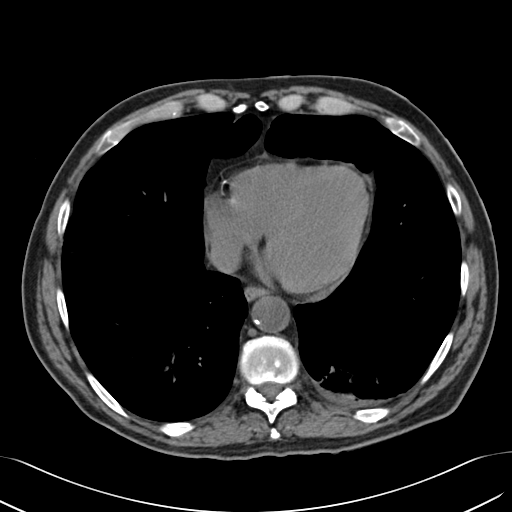
[im 132/138  lung]
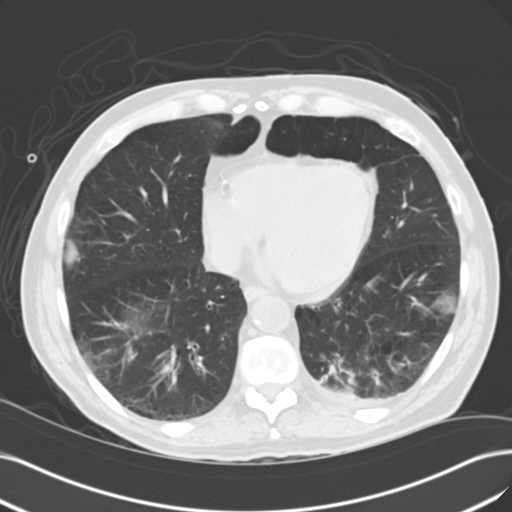

[14 of 32 positions shown; findings below may reference images not displayed]

FINDINGS: There is moderate left hydronephrosis with surrounding perinephric
edema. There is a large, left renal pelvic stone at the ureteropelvic
junction showing a diameter of approximately 1.3 cm. Urologic consultation
and follow-up is recommended. Additional smaller stones are seen in both
kidneys. Images demonstrate two, adjacent, large calcific densities in the
region of the distal left ureter which could be distal left ureteral calculi
measuring up to 1.3 cm in diameter for the more superior on the axial images
and 1.2 cm for the more distal or inferior of these two structures. There is
no hydronephrosis on the right. Cholelithiasis is present. The urinary
bladder shows a thickened wall. Correlate for urinary tract infection and
cystitis. There is a 1.3 cm, noncalcified soft tissue nodule in the anterior
medial aspect of the left lower lobe just posterior to the major fissure and
similar in appearance compared to the previous chest CT dated 16 March, 2012.
There are underlying emphysematous changes in both lungs with early
bronchiectasis in both lower lobes with some evidence of atelectasis and/or
fibrosis. Bullous changes are seen as well at the lung bases.

Atherosclerotic calcification is present. The liver, pancreas, spleen,
adrenal glands and stomach appear to be unremarkable. There is no definite
adenopathy. There is no abnormal bowel distention or definite bowel wall
thickening. It appears the patient has undergone previous hysterectomy. The
appendix is not definitely identified. There is no abdominal aortic aneurysm.
IMPRESSION: 1.  Bilateral nephrolithiasis, left greater than right. Large, left
ureteropelvic junction stone with fairly significant left hydronephrosis.
Urologic follow-up is recommended. There may be two, large, distal left
ureteral calculi just proximal to the left ureterovesical junction measuring
up to 1.3 to 1.4 mm. There is perinephric and periureteric stranding on the
left. The urinary bladder wall is thickened. Correlate for urinary tract
infection.
2.  Trace, left pleural effusion with a left lung base nodule. Continued
follow-up is recommended. Chronic lung disease is present.
3.  Not mentioned above is a small amount of pelvic ascites.

[REDACTED]

## 2014-10-09 ENCOUNTER — Emergency Department: Payer: Self-pay | Admitting: Emergency Medicine

## 2014-11-10 ENCOUNTER — Other Ambulatory Visit: Payer: Self-pay | Admitting: Psychiatry

## 2014-11-10 ENCOUNTER — Inpatient Hospital Stay: Payer: Self-pay | Admitting: Internal Medicine

## 2014-11-10 LAB — COMPREHENSIVE METABOLIC PANEL
ALBUMIN: 3.8 g/dL
ALK PHOS: 43 U/L
ALT: 22 U/L
Anion Gap: 4 — ABNORMAL LOW (ref 7–16)
BUN: 27 mg/dL — AB
CREATININE: 1.1 mg/dL
Calcium, Total: 8.3 mg/dL — ABNORMAL LOW
Chloride: 87 mmol/L — ABNORMAL LOW
Co2: 49 mmol/L
EGFR (African American): >60
EGFR (Non-African Amer.): >60
Glucose: 260 mg/dL — ABNORMAL HIGH
Potassium: 3.9 mmol/L
SGOT(AST): 23 U/L
SODIUM: 140 mmol/L
TOTAL PROTEIN: 7.2 g/dL

## 2014-11-10 LAB — CBC
HCT: 35.9 % — ABNORMAL LOW (ref 40.0–52.0)
HGB: 11.8 g/dL — ABNORMAL LOW (ref 13.0–18.0)
MCH: 31 pg (ref 26.0–34.0)
MCHC: 33 g/dL (ref 32.0–36.0)
MCV: 94 fL (ref 80–100)
Platelet: 232 10*3/uL (ref 150–440)
RBC: 3.82 10*6/uL — ABNORMAL LOW (ref 4.40–5.90)
RDW: 14.4 % (ref 11.5–14.5)
WBC: 7 10*3/uL (ref 3.8–10.6)

## 2014-11-10 LAB — CBC WITH DIFFERENTIAL/PLATELET
Basophil #: 0 10*3/uL (ref 0.0–0.1)
Basophil %: 0.4 %
EOS ABS: 0.3 10*3/uL (ref 0.0–0.7)
EOS PCT: 3.7 %
HCT: 37.7 % — ABNORMAL LOW (ref 40.0–52.0)
HGB: 12.1 g/dL — ABNORMAL LOW (ref 13.0–18.0)
Lymphocyte #: 1.4 10*3/uL (ref 1.0–3.6)
Lymphocyte %: 15.9 %
MCH: 29.9 pg (ref 26.0–34.0)
MCHC: 32 g/dL (ref 32.0–36.0)
MCV: 94 fL (ref 80–100)
MONO ABS: 0.9 x10 3/mm (ref 0.2–1.0)
Monocyte %: 10.1 %
NEUTROS PCT: 69.9 %
Neutrophil #: 6 10*3/uL (ref 1.4–6.5)
Platelet: 255 10*3/uL (ref 150–440)
RBC: 4.03 10*6/uL — AB (ref 4.40–5.90)
RDW: 14.4 % (ref 11.5–14.5)
WBC: 8.7 10*3/uL (ref 3.8–10.6)

## 2014-11-10 LAB — HEPATIC FUNCTION PANEL A (ARMC)
ALBUMIN: 3.9 g/dL
Alkaline Phosphatase: 41 U/L
BILIRUBIN INDIRECT: 0.3
Bilirubin,Total: 0.3 mg/dL
SGOT(AST): 22 U/L
SGPT (ALT): 22 U/L
Total Protein: 7.5 g/dL

## 2014-11-10 LAB — CK TOTAL AND CKMB (NOT AT ARMC)
CK, Total: 46 U/L — ABNORMAL LOW
CK-MB: 2.4 ng/mL

## 2014-11-10 LAB — TROPONIN I

## 2014-11-10 LAB — PRO B NATRIURETIC PEPTIDE: B-Type Natriuretic Peptide: 109 pg/mL — ABNORMAL HIGH

## 2014-11-17 LAB — CARBAMAZEPINE LEVEL, TOTAL: Carbamazepine: 4.2 ug/mL (ref 4.0–12.0)

## 2014-11-22 DIAGNOSIS — I10 Essential (primary) hypertension: Secondary | ICD-10-CM | POA: Insufficient documentation

## 2014-11-22 DIAGNOSIS — F09 Unspecified mental disorder due to known physiological condition: Secondary | ICD-10-CM | POA: Insufficient documentation

## 2014-12-05 NOTE — Op Note (Signed)
PATIENT NAME:  Roger Moore, Roger Moore MR#:  [id number] DATE OF BIRTH:  August 30, 1945  DATE OF PROCEDURE:  05/04/2012  PREOPERATIVE DIAGNOSIS: Bilateral hydroceles.   POSTOPERATIVE DIAGNOSES:  1. Bilateral hydroceles. 2. Left inguinal hernia.   PROCEDURES:  1. Left inguinal herniorrhaphy. 2. Left hydrocelectomy.   SURGEON: Denice Bors. Jacqlyn Larsen, MD  ANESTHESIA: Spinal.   INDICATIONS: The patient is a 69 year old gentleman with a history of benign prostatic hypertrophy and bladder outlet obstruction. He underwent recent transurethral resection of the prostate for the urinary retention. He has been voiding well since that time. He also has significant left hydrocele/hernia. Differentiation is difficult due to the large size and findings on previous CT scan based on physical findings this may represent an inguinal hernia versus large hydrocele. He presents for potential bilateral hydrocelectomy.   DESCRIPTION OF PROCEDURE: After informed consent was obtained, the patient was taken to the Operating Room and placed in the supine position on the operating table under spinal anesthesia with adequate levels. The patient was then prepped and draped in the usual standard fashion. Based on the finding the initial decision was made to proceed with a scrotal approach due to the probability of bilateral hydroceles based on previous CT evaluation. A midline scrotal incision was made approximately 6 cm in length. The incision was continued down to expose what was felt to be a left hydrocele. The hydrocele was delivered from the hemiscrotum. The hydrocele cavity was opened. Approximately 750 mL of clear fluid was drained. After the hydrocele was opened it was noted to extend fairly high into the inguinal region. The testicle and separate hydrocele cavity was identified surrounding the testicle. The hydrocele cavity itself was noted to be fairly small. The findings were more consistent with an inguinal hernia. There was no evidence of  intestine within the sac. The inguinal area demonstrated no significant defect to suggest outright muscular defect consistent with an adult hernia. This is more consistent with a pediatric type hernia. No definitive opening was noted into the peritoneum, however, it was noted to be very close. The hydrocele sac was then dissected free from the cord contents and testicle. It was dissected back to the level of the internal ring. It was twisted and suture ligated utilizing a 1-0 silk suture. A subsequent 1-0 silk suture was then utilized to ligate the sac. The bulk of the sac was then transected. This was sent to pathology for further evaluation. The hydrocele sac was then opened. It was everted posterior to the testicle. It was secured utilizing interrupted 2-0 chromic sutures posterior to the testicle. Several small spermatoceles were identified. The decision was made not to proceed with any further intervention regarding this aspect. The testicle was returned to the hemiscrotal compartment. Due to the small size of the right hydrocele the decision was made not to proceed with any further intervention. The left hemiscrotum was closed in two layers utilizing 2-0 chromic sutures. The skin was covered with collodion, fluffs and a scrotal support was then placed. The skin was anesthetized utilizing approximately 8 mL of half-strength Marcaine. The patient tolerated the procedure well. There were no problems or complications. Estimated blood loss was overall minimal.   ____________________________ Denice Bors. Jacqlyn Larsen, MD bsc:cms D: 05/04/2012 22:42:40 ET T: 05/05/2012 09:58:23 ET JOB#: [id number]  cc: Denice Bors. Jacqlyn Larsen, MD, <Dictator>  Denice Bors Johnay Mano MD ELECTRONICALLY SIGNED 05/06/2012 8:19

## 2014-12-08 NOTE — Discharge Summary (Signed)
PATIENT NAME:  Roger Moore, Roger Moore MR#:  [id number] DATE OF BIRTH:  Aug 20, 1945  DATE OF ADMISSION:  10/07/2012 DATE OF DISCHARGE:  10/08/2012  PRINCIPAL DIAGNOSES: Left ureterolithiasis, left nephrolithiasis, hydronephrosis, acute renal failure, sepsis.   PROCEDURES: Cystoscopy, left double-J ureteral stent placement, suture removal from previous back surgery.   INDICATION: Roger Moore is a 70 year old gentleman with a history of prostatic hypertrophy and recent urinary retention with acute renal failure. He underwent TURP with subsequent improvement in his voiding symptoms. He then underwent a large hydrocele repair. He has been experiencing left-sided flank pain and discomfort. He presented with an elevated white blood cell count, fever, with a CT scan showing at least 2 large 1+ cm stones, in the lower left ureter, with an over 2 cm stone at the UPJ with additional stones within the kidney. These are new since June of 2013. He was taken to the operating room emergently for stent placement for this purpose.   HOSPITAL COURSE: Roger Moore was taken to the operating room on 10/07/2012 at which time he underwent cystoscopy, left double-J ureteral stent placement. A moderate amount of debris-filled urine was noted extruding from the stent. He had a spike of temperature to 103, in the recovery room. He subsequently defervesced. He has remained afebrile, vital signs stable since the stent placement. He has had no significant pain or discomfort. He has required no pain medication. He has been voiding spontaneously after catheter removal. He was on imipramine prior to presentation. This can effect bladder emptying and as a result can aggravate potential stone formation if there is an increased residual. This has been borderline but acceptable through the past. He is tolerating a general diet. He has otherwise done very well post-procedure. The usual pattern post-obstruction with impending sepsis is to have continued  spiking fever. This has not been documented. We have therefore elected to proceed with discharge to home. He will be scheduled for left ureteroscopy with holmium laser lithotripsy and stent replacement in approximately 2 weeks. He will need additional therapy with ESWL to the remaining renal calculi. He will need an eventual 29-BMWU urine metabolic stone evaluation for the rapid onset of stone formation. He was discharged on his usual admission medications with the exception for    the imipramine. He is to discontinue this medication. He was discharged with Percocet 1 to 2 every 4 to 6 hours as needed for pain and Cipro 500 mg twice daily for a 10 day course. He is to notify us if there are any further problems or questions in the interim.  ____________________________ Denice Bors Jacqlyn Larsen, MD bsc:sb D: 10/08/2012 08:09:01 ET T: 10/08/2012 12:18:13 ET JOB#: [id number]  cc: Denice Bors. Jacqlyn Larsen, MD, <Dictator> Denice Bors Toby Ayad MD ELECTRONICALLY SIGNED 10/13/2012 8:00

## 2014-12-08 NOTE — Op Note (Signed)
PATIENT NAME:  Roger Moore, Roger Moore MR#:  [id number] DATE OF BIRTH:  November 03, 1945  DATE OF PROCEDURE:  08/25/2012  PREOPERATIVE DIAGNOSIS:  Senile cataract, left eye.  POSTOPERATIVE DIAGNOSIS:  Senile cataract, left eye.  PROCEDURE:  Phacoemulsification with posterior chamber intraocular lens implantation of the left eye.  LENS:  ZCBOO 19.5-diopter posterior chamber intraocular lens.  ULTRASOUND TIME:  12% of 1 minute, 11 seconds. CDE 8.5.  SURGEON:  Mali Charnise Lovan, MD  ANESTHESIA:  Topical with tetracaine drops and 2% Xylocaine jelly.  COMPLICATIONS:  None.  DESCRIPTION OF PROCEDURE:  The patient was identified in the holding room and transported to the operating room and placed in the supine position under the operating microscope.  The left eye was identified as the operative eye and it was prepped and draped in the usual sterile ophthalmic fashion.  A 1 millimeter clear-corneal paracentesis was made at the 1:30 position.  The anterior chamber was filled with Viscoat viscoelastic.  A 2.4 millimeter keratome was used to make a near-clear corneal incision at the 10:30 position.  A curvilinear capsulorrhexis was made with a cystotome and capsulorrhexis forceps.  Balanced salt solution was used to hydrodissect and hydrodelineate the nucleus.  Phacoemulsification was then used in horizontal chopping fashion to remove the lens nucleus and epinucleus.  The remaining cortex was then removed using the irrigation and aspiration handpiece. Provisc was then placed into the capsular bag to distend it for lens placement.  A ZCBOO 19.5-diopter lens was then injected into the capsular bag.  The remaining viscoelastic was aspirated.  Wounds were hydrated with balanced salt solution. The anterior chamber was inflated to a physiologic pressure of with balanced salt solution. No wound leaks were noted, and 0.1 mL of cefuroxime at a concentration of 10 mg/mL was injected into the anterior chamber for a dose of 1 mg  of cefuroxime at the completion of the procedure. Miostat was placed into the anterior chamber to constrict the pupil.  No wound leaks were noted.  Topical Vigamox drops and Maxitrol ointment were applied to the eye.  The patient was taken to the recovery room in stable condition without complications of anesthesia or surgery.  ____________________________ Wyonia Hough, MD crb:cb D: 08/25/2012 15:21:00 ET T: 08/25/2012 17:45:18 ET JOB#: [id number]  cc: Wyonia Hough, MD, <Dictator>  Leandrew Koyanagi MD ELECTRONICALLY SIGNED 09/01/2012 9:34

## 2014-12-08 NOTE — Discharge Summary (Signed)
PATIENT NAME:  Roger Moore, Roger Moore MR#:  [id number] DATE OF BIRTH:  04-19-1946  DATE OF ADMISSION:  12/21/2012 DATE OF DISCHARGE:  12/24/2012  PRINCIPAL DIAGNOSIS: Left nephrolithiasis.   PROCEDURE: Left percutaneous nephrolithotomy.   INDICATIONS: The patient is a 69 year old gentleman who has had multiple medical problems. He developed rapidly progressive stone growth in both kidneys with subsequent obstruction necessitating stent placement on the left. He underwent ESWL with some fragmentation. A large amount of stone fragment and stones remained within the left renal pelvis and lower calyces. Due to the stone burden and continued need for stent placement, we have elected to proceed with percutaneous nephrolithotomy for more definitive treatment.   HOSPITAL COURSE:  Roger Moore was admitted on 12/21/2012 at which time he underwent a left percutaneous nephrolithotomy. A previous nephroureteral stent was placed by interventional radiology just prior to the procedure. The procedure proceeded well with no significant problems or complications. No significant intraoperative bleeding was noted. The patient's postoperative course was significant for decreased hemoglobin. He has a history of significant anemia with a hemoglobin in the 7 range. One year prior he had increased to around 12. The exact etiology of the decreased hemoglobin is uncertain as no hemoglobin was obtained in the days just prior to the procedure. With a percutaneous nephrolithotomy, it is certainly possible to bleed around the kidney, although no significant bleeding through the urinary tract was appreciated. His hemoglobin dropped to 7.1. With his pulmonary status and need for chronic oxygen, the decision was made to proceed with 2 units of blood transfusion. His subsequent hemoglobin returned at 8.7. His renal function was overall excellent at 1.25 for serum creatinine by the date of discharge.  He was noted to have good urine output, both  through the Foley and nephrostomy tube. The nephrostomy tube was removed on postoperative day 2 under fluoroscopic guidance to avoid inadvertent removal of the indwelling stent. This was performed without difficulty. He otherwise remained afebrile, vital signs stable throughout his hospitalization. He was tolerating a general diet. He was ambulating with minimal difficulty. He did require continued oxygen therapy as he had prior to admission. His oxygen levels remained relatively stable. His pain was reasonably controlled on oral medication. There was no significant abdominal tenderness or distention as would be expected with a significant retroperitoneal bleed.  He continued to do well and was subsequently discharged to home on 12/24/2012.  He is to follow up in 1 to 2 weeks for suture and stent removal in the office. He was discharged on his usual admission medications in addition to Percocet 1 to 2 every 4 to 6 hours as needed for pain and Cipro 500 mg twice daily for a 7 day course. He is to avoid any aspirin or aspirin-containing products for 1 week. He is to notify us if there are any further problems or questions in the interim.  ____________________________ Denice Bors Jacqlyn Larsen, MD bsc:sb D: 01/07/2013 07:49:17 ET T: 01/07/2013 09:31:31 ET JOB#: [id number]  cc: Denice Bors. Jacqlyn Larsen, MD, <Dictator> Denice Bors Ondrea Dow MD ELECTRONICALLY SIGNED 01/11/2013 10:01

## 2014-12-08 NOTE — Op Note (Signed)
PATIENT NAME:  Roger Moore, Roger Moore MR#:  [id number] DATE OF BIRTH:  Apr 26, 1946  DATE OF PROCEDURE:  10/19/2012  PRINCIPAL DIAGNOSIS: Left ureterolithiasis.   POSTOPERATIVE DIAGNOSIS: Left ureterolithiasis.   PROCEDURE: Left ureteroscopy with holmium laser lithotripsy, left double-J ureteral stent placement.   SURGEON: Denice Bors. Jacqlyn Larsen, MD   ANESTHESIA: Laryngeal mask airway anesthesia.   INDICATIONS: The patient is a 69 year old gentleman who was recently admitted with sepsis and left ureteral obstruction related to large distal ureteral calculi in addition to large left UPJ and renal calculi. He underwent stent placement. He presents for ureteroscopic stone removal of the large distal ureteral stones.   PROCEDURE: After informed consent was obtained, the patient was taken to the operating room and placed in the dorsal lithotomy position under laryngeal mask airway anesthesia. The patient was then prepped and draped in the usual standard fashion. The 16 French rigid cystoscope was introduced into the urethra under direct vision, with no urethral abnormalities noted. A prominent TUR defect was noted. Upon entering the prostatic fossa, the bladder neck was open. Upon entering the bladder, a stent was noted protruding from a lateral left ureteral orifice. It was at a slight angulation which made placement of a guidewire into the ureteral orifice difficult, and open-ended catheter was utilized to help facilitate guidewire passage alongside of the stent. This was subsequently obtained. The stent was then removed utilizing grasping forceps. Ureteroscopy was then performed with a 6 French rigid ureteroscope. The distal ureter was noted to be very tortuous right before the level of the stones. A large dilated portion of the ureter was then entered, with 2 large stones encountered, the first being approximately 1.1 to 1.2 cm. The second stone was approximately 1 to 1.1 cm in size. There appeared to be several smaller  stones also present. The holmium laser fiber was then utilized to fragment the stones into multiple smaller pieces. The stone was noted to be very hard, requiring increased wattage on the laser for proper fragmentation. Due to the size of the stones, prolonged treatment was required for stone fragmentation. The stones were broken into multiple small pieces. A number of the pieces were basket extracted due to the angulation of the ureter approaching the UVJ. The decision was made after extraction of a sizable portion of the stones to halt further attempts at extraction due to trauma to the mucosa An attempt was made at passing the scope to the level of the ureteropelvic junction with hopes of initiating fragmentation of the renal pelvic stone. The scope could be advanced to just above the crossing vessels. Due to the lack of relaxation with the LMA, the scope was unable to be passed any further safely. The ureteroscope was removed. The cystoscope was replaced back into the urinary bladder, back-loaded over the guidewire. A 7 French x 24 cm double-J ureteral stent was advanced over the guidewire into the upper pole collecting system without difficulty. Adequate curl was noted within the renal pelvis. Adequate curl was also noted within the urinary bladder. The stone fragments were irrigated free from the bladder. These were collected and will be sent for stone analysis. The patient was returned to the supine position and awakened from laryngeal mask airway anesthesia. He was taken to the recovery room in stable condition. There were no problems or complications. The patient tolerated the procedure well.     ____________________________ Denice Bors. Jacqlyn Larsen, MD bsc:OSi D: 10/20/2012 83:38:25 ET T: 10/20/2012 08:18:05 ET JOB#: [id number]  cc: Denice Bors. Jacqlyn Larsen,  MD, <Dictator> Denice Bors COPE MD ELECTRONICALLY SIGNED 10/21/2012 18:20

## 2014-12-08 NOTE — Op Note (Signed)
PATIENT NAME:  Roger Moore, Roger Moore MR#:  [id number] DATE OF BIRTH:  03/12/1946  DATE OF PROCEDURE:  10/07/2012.  PRINCIPAL DIAGNOSIS: Left ureterolithiasis, left nephrolithiasis, hydronephrosis, sepsis, acute renal failure.   POSTOPERATIVE DIAGNOSIS: Left ureterolithiasis, left nephrolithiasis, hydronephrosis, sepsis, acute renal failure.   PROCEDURE: Cystoscopy, left double-J ureteral stent placement.   SURGEON: Denice Bors. Jacqlyn Larsen, M.D.   ANESTHESIA: General mask anesthesia.   INDICATIONS: The patient is a 69 year old gentleman with a history of prostatic hypertrophy and urinary retention. He has been experiencing left flank and lower quadrant discomfort. He presented to the Emergency Room due to ongoing issues.   (CANCELED BY DICTATOR, states he dictated it the day prior)  ____________________________ Denice Bors. Jacqlyn Larsen, MD bsc:jm D: 10/08/2012 08:04:43 ET T: 10/08/2012 12:03:20 ET JOB#: [id number]  cc: Denice Bors. Jacqlyn Larsen, MD, <Dictator>

## 2014-12-08 NOTE — Op Note (Signed)
PATIENT NAME:  Roger Moore, SHANKAR MR#:  [id number] DATE OF BIRTH:  10/12/1945  DATE OF PROCEDURE:  10/07/2012  PREOPERATIVE DIAGNOSES: Left ureterolithiasis, left nephrolithiasis, hydronephrosis, acute renal failure, sepsis.    POSTOPERATIVE DIAGNOSIS: Left ureterolithiasis, left nephrolithiasis, hydronephrosis, acute renal failure, sepsis.    PROCEDURES: Cystoscopy, left double-J ureteral stent placement, suture removal from prior surgery on his back for skin cancer.   SURGEON: Edrick Oh, MD  ANESTHESIA: Minimal airway anesthesia.   INDICATION: The patient is a 69 year old gentleman with a history of recent urinary retention and acute renal failure related to the urinary retention. He has had a return of his renal function back to a normal level. He has been experiencing abdominal pain and nausea with general malaise. He presented to the Emergency Room with fever of 101.5. He was found to have a significant elevated white blood cell count. He had a CT scan performed demonstrating 2 approximate 1.1 to 1.2 cm stones, in the distal left ureter, with obstruction. He also has an approximate 2 cm stone at the left UPJ. Multiple other stones are present within the left kidney. A few small stones are noted on the right without obstruction. He had no stones demonstrated on a CT scan in June. This is a new finding of undetermined etiology. Due to the acute renal failure, impending sepsis and obstruction with multiple large stones he presents for stent placement. He also underwent recent surgery at [facility] for skin cancer on his back. He has an approximate 6 cm incision in the mid lower back with sutures in place. He was to have had these sutures removed earlier this week. He missed the appointment due to the current illness. A request has been made by his physicians at [facility] for suture removal.   DESCRIPTION OF PROCEDURE: After informed consent was obtained, the patient was taken to the operating room and placed in the  dorsal lithotomy position under general mask airway anesthesia. The patient was then prepped and draped in the usual standard fashion. The 22-French rigid cystoscope was introduced into the urethra under direct vision with no urethral abnormalities noted. Upon entering the prostatic fossa, a large TUR defect was noted. A small nodule of tissue was noted extending off of the bladder neck anteriorly. This is consistent with some intravesical prostate that remains. This is overall very small with no evidence of obstruction. Upon entering the bladder, the mucosa was inspected in its entirety with no gross mucosal lesions noted. Bilateral ureteral orifices were well visualized with no lesions noted. A attempt was made at guidewire passage into the left ureteral orifice. Due to the orientation, this was unsuccessful. The pusher of the stent was placed through the cystoscope. The guidewire was advanced through the pusher. This was utilized to help manipulate the guidewire into the left ureteral orifice. Resistance was met. Multiple attempts were made at passing the guidewire past the stone. With continued manipulation this was successful. Resistance was also met, at the level of the ureteropelvic junction, with the other large stone at this site. The guidewire was advanced into the renal pelvis. A 6-French x 24 cm double-J ureteral stent was advanced over the guidewire. Some resistance was met at the level above stones, however, was able to be passed. Adequate curl was noted within the renal pelvis. Adequate curl was also noted within the urinary bladder. A moderate amount of dark purulent-appearing material was noted to drain from the stent with placement. The bladder was then drained. The cystoscope was removed.  The patient was returned to the supine position. He was rolled on his side. Approximately 12 to 15 Prolene sutures were removed from the back incision utilizing standard technique. It was covered with clean dry  gauze. The incision appears to be healing very well. There was no evidence of abscess or infection. There was no skin edge opening. The patient tolerated the procedure well. There were no problems or complications. He was then taken to the recovery room in stable condition.  ____________________________ Denice Bors Jacqlyn Larsen, MD bsc:sb D: 10/07/2012 12:14:51 ET T: 10/07/2012 12:47:47 ET JOB#: [id number]  cc: Denice Bors. Jacqlyn Larsen, MD, <Dictator> Denice Bors Lari Linson MD ELECTRONICALLY SIGNED 10/08/2012 8:14

## 2014-12-08 NOTE — Op Note (Signed)
PATIENT NAME:  Roger Moore, Roger Moore MR#:  [id number] DATE OF BIRTH:  [birth year]  DATE OF PROCEDURE:  12/21/2012  PRINCIPAL DIAGNOSIS: Left nephrolithiasis.   POSTOPERATIVE DIAGNOSIS: Left nephrolithiasis.  PROCEDURE: Left percutaneous nephrolithotomy.   SURGEON: Edrick Oh, MD   ANESTHESIA: General endotracheal anesthesia.   INDICATIONS: The patient is a 69 year old gentleman who recently presented with bilateral hydronephrosis. He was noted to have multiple large bilateral renal calculi. He had left-sided obstruction necessitating stent placement. He has undergone ESWL with some fragmentation of multiple large stones. There are still multiple large stones remaining. He presents for percutaneous nephrolithotomy.   DESCRIPTION OF PROCEDURE: After informed consent was obtained, the patient was taken to the operating room and placed in the prone position on the operating table under general endotracheal anesthesia. The patient was then prepped and draped in the usual standard fashion. A previously placed nephroureteral stent was in place on the left. This was placed in Radiology prior to the procedure. A stent previously placed was also left indwelling. A guidewire was advanced through the nephroureteral stent into the urinary bladder without difficulty. The nephroureteral stent was then removed. Dilation was then undertaken with a 12-French dilator. The stiffener was placed over the guidewire. The sheath was then placed over the stiffener. A Super Stiff guidewire was advanced through the sheath into the urinary bladder without significant difficulty. The sheath was then removed. The stiffener was left in place. The dilation was then undertaken from 14-French to 28-French without difficulty. The 28-French sheath was left in place. Nephroscopy was then performed. As the kidney was entered, a large stone was encountered completely obscuring the vision. The stone was approximately 3 cm in size. This was broken  utilizing the CyberWand with the pieces being evacuated by suction. At least 2 or 3 additional stones were encountered, each approximately 1 cm in size. One of these was in the lower pole calyx. This was probably 1.5 cm in size. All of these were broken utilizing the CyberWand and evacuated. A large dilated renal pelvis was visualized. An upper pole calyx was unable to be navigated with the flexible or rigid scope. Several lateral and inferior calyces were entered. No additional stones were appreciated. Several small stone fragments were noted. These were evacuated utilizing the CyberWand. No significant bleeding was encountered. Some encrustation was noted on the stent. This was removed utilizing the CyberWand. The stent was otherwise left in place. A 10-French nephrostomy tube was advanced through the sheath. On initial placement, the curl of the nephrostomy engaged the curl of the stent. A flexible cystoscope was inserted through the sheath. The nephrostomy tube was inserted alongside of the cystoscope. This allowed visualization of the curl and placement of the nephrostomy curl. They were noted to be in close position, but not interlocked. The nephrostomy tube was locked in position. The cystoscope was removed. The sheath was then removed and incised on its edge and removed in its entirety. The nephrostomy tube was then secured to the skin utilizing a 0 Prolene suture. Two additional interrupted 0 Prolene sutures were utilized to close the nephrostomy site. The guidewires were then removed under fluoroscopic guidance. A clean, dry dressing was applied to the nephrostomy site. The patient was returned to the supine position and awakened from general endotracheal anesthesia. He was taken to the recovery room in stable condition. There were no problems or complications. The patient tolerated the procedure well.     ____________________________ Denice Bors. Jacqlyn Larsen, MD bsc:es D: 12/22/2012 07:59:36 ET T:  12/22/2012  08:40:57 ET JOB#: [id number]  cc: Denice Bors. Jacqlyn Larsen, MD, <Dictator> Denice Bors Eleuterio Dollar MD ELECTRONICALLY SIGNED 12/22/2012 16:31

## 2014-12-10 NOTE — Consult Note (Signed)
Cr trending down.foley 7-10 daysneed F/U OP for voiding trial and cystoscopyfor any other questions  Electronic Signatures: Murrell Redden (MD)  (Signed on 15-Apr-13 08:36)  Authored  Last Updated: 15-Apr-13 08:36 by Murrell Redden (MD)

## 2014-12-10 NOTE — Discharge Summary (Signed)
PATIENT NAME:  Roger Moore, HUFSTETLER MR#:  [id number] DATE OF BIRTH:  [birth year]  DATE OF ADMISSION:  01/06/2012 DATE OF DISCHARGE:  01/07/2012  PREOPERATIVE DIAGNOSES: Benign prostatic hypertrophy with median lobe formation, urinary retention, bladder mass.   PROCEDURES: Transurethral resection of the prostate, cystoscopy, bladder biopsy.   INDICATION: The patient is a 69 year old gentleman who presented to the hospital in urinary retention. He failed medical management and voiding trials. Cystoscopy demonstrated prominent median lobe formation with bilateral prostatic hypertrophy. He was also noted to have a bladder mass on the anterior bladder wall suspicious for possible tumor. He presents for bladder biopsy and transurethral resection of the prostate.   HOSPITAL COURSE: He was admitted on 01/06/2012. He was taken to the Operating Room and underwent transurethral resection of the prostate and bladder biopsy without significant intraoperative problems or complications. Of note, he was noted to have a cardiac arrhythmia consistent with probable atrial flutter at the onset of the procedure. He converted to normal sinus rhythm throughout the remainder of the procedure. His postoperative course was without significant problems or complications. He remained afebrile. Vital signs stable throughout his hospitalization. He remained in normal sinus rhythm. Cardiac evaluation was undertaken which suggested monitoring only at this time. He was maintained on continuous bladder irrigation which was essentially clear throughout the postoperative day. On postoperative day one the catheter and bladder irrigation was discontinued. He continued to remain afebrile, vital signs stable. He was noted to void spontaneously with an acceptable minimal postvoid residual. He was tolerating a general diet. He continued to do well. His labs demonstrated a low magnesium which was replaced prior to discharge. He was subsequently discharged  to home with his usual admission medications in addition to Vicodin 1 to 2 every 4 to 6 hours as needed for pain, Cipro 500 mg twice daily for seven days and nitrofurantoin 100 mg daily to start the Cipro. He is to hold his aspirin for approximately three weeks. He is to follow up in 10 days for bladder scan. He is to notify us if there are any further problems or questions in the interim.   ____________________________ Denice Bors Jacqlyn Larsen, MD bsc:cms D: 01/13/2012 22:29:44 ET T: 01/14/2012 12:01:36 ET  JOB#: [id number] Naavya Postma S Feven Alderfer MD ELECTRONICALLY SIGNED 01/19/2012 7:56

## 2014-12-10 NOTE — H&P (Signed)
PATIENT NAME:  Roger Moore, Roger Moore MR#:  [id number] DATE OF BIRTH:  03-Nov-1945  DATE OF ADMISSION:  11/28/2011  PRIMARY CARE PHYSICIAN: Delight Stare, MD, [facility]   PRIMARY PULMONOLOGIST: Dr. Raul Del   CHIEF COMPLAINT: Confusion and weakness.   HISTORY OF PRESENT ILLNESS: This is a very pleasant 69 year old male with a history of severe COPD, hypertension, and hyperlipidemia who presents with the above complaint. The patient is actually confused so the history is given by the patient's son who is at bedside. For the past two days the patient has had confusion, fevers, and been very weak. Also starting Tuesday he had some indigestion. According to the patient's brother, he was belching all night. The patient has had no urinary symptoms. He is urinating fine. No diarrhea or constipation. No nausea or vomiting. He had a fever but unknown what is temperature was.   REVIEW OF SYSTEMS: CONSTITUTIONAL: Positive fever. Positive fatigue and weakness. No weight loss or gain. EYES: No blurred or double vision. He has a mass growing underneath the left eye which he is seeing a dermatologist for. RESPIRATORY: No cough, wheezing, or hemoptysis. Positive COPD on 2 liters oxygen. CARDIOVASCULAR: Denies any chest pain, syncope, or palpitations. GI: No nausea, vomiting, diarrhea, abdominal pain. Positive abdominal distention. GU: No dysuria or hematuria. ENDOCRINE: No thyroid problems, increased sweating. HEME/LYMPH: No easy bruising. SKIN: No rashes or lesions. He does have a mass growing from the left side. He has conjunctival discharge on the left side. MUSCULOSKELETAL: No limited activity. NEUROLOGIC: No history of CVA or TIA. PSYCH: No history of anxiety or depression.  PAST MEDICAL HISTORY: 1. Chronic obstructive pulmonary disease, on 2 liters of oxygen.  2. Hyperlipidemia.  3. Hypertension.   MEDICATIONS:  1. Advair Diskus 250/50 b.i.d.  2. Spiriva 18 mcg daily.  3. Xanax 0.25 mg q.6 hours p.r.n.   4. Pulmicort 0.25 mg.  5. Protonix 40 mg daily.  6. Aspirin 325 daily.  7. Rapaflo 8 mg daily. 8. Zyrtec 10 mg daily.  9. Symbicort 160/4.5 daily.  10. Lasix 20 mg daily.  11. Metoprolol 12.5 b.i.d.  12. Tegretol 200 mg b.i.d.  13. Lovastatin 20 mg daily.  14. Norvasc 5 mg daily.   ALLERGIES: No known drug allergies.   PAST SURGICAL HISTORY:  1. Appendectomy.  2. 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 3. 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   FAMILY HISTORY: Positive for coronary artery disease, hypertension, and diabetes.   SOCIAL HISTORY: The patient quit smoking about six months ago. No alcohol or IV drug use.   PHYSICAL EXAMINATION:   VITAL SIGNS: Temperature 100.8, pulse 105, respirations 22, blood pressure 137/61, 93% on room air.   GENERAL: The patient is alert. He is oriented to name and place but not time or president.   HEENT: Head is atraumatic. Pupils are round and reactive. He has some conjunctival discharge which is yellowish, states this is actually chronic. He has a large mass under his left eye as well. Oropharynx is clear.   NECK: Supple without JVD, carotid bruit, or enlarged thyroid.   CARDIOVASCULAR: Regular rate and rhythm. No murmurs, gallops, or rubs. PMI is not displaced.   LUNGS: Clear to auscultation without crackles, rales, rhonchi, or wheezing. No dullness to percussion.    BACK: No CVA or vertebral tenderness.   ABDOMEN: Slightly distended without tenderness, rebound, or guarding.   EXTREMITIES: No clubbing, cyanosis, or edema.   NEUROLOGIC: Cranial nerves II through XII are grossly intact. No focal deficits.  SKIN: As mentioned, he has a mass under his left eye. No other rash or lesions.   LABORATORY, DIAGNOSTIC, AND RADIOLOGICAL DATA: White blood cells 28, hemoglobin 9.7, hematocrit 31, platelets 277, sodium 139, potassium 3.7, chloride 101, bicarb 29, BUN 63, creatinine 2.98, glucose 153, calcium 8.8, bilirubin 0.3, alkaline phosphatase  47, ALT 17, AST 21, total protein 7.8, albumin 2.9. Troponin less than 0.02. Urinalysis shows 3+ LCE, 174 white blood cells, 1+ bacteria, 11 red blood cells. Lactic acid 1.1.   Chest x-ray shows no acute infiltrate. EKG normal sinus rhythm. No ST elevation or depressions.   ASSESSMENT AND PLAN: This is a 69 year old male who presented with confusion, slightly distended abdomen and fever with sepsis. 1. Sepsis causing leukocytosis and fever likely secondary to a urinary tract infection. Will treat urinary tract infection as outlined below.  2. Urinary tract infection. The patient has had Enterococcus in the past which was sensitive to Levaquin. Will treat with Levaquin for now and follow urine cultures as well as blood cultures.  3. Leukocytosis in the setting of, sepsis. Will follow blood cultures and urine cultures. Repeat a CBC in the a.m.  4. Anemia. Will repeat a CBC in the morning. If the hemoglobin has dropped, would recommend a work-up.  5. Chronic obstructive pulmonary disease. Will continue oxygen and the patient's outpatient medications. 6. Hypertension. Continue outpatient medications.  7. Hyperlipidemia. Continue lovastatin.   CODE STATUS: FULL CODE status.   TIME SPENT: Approximately 45 minutes.   ____________________________ Donell Beers. Benjie Karvonen, MD spm:drc D: 11/28/2011 02:28:01 ET T: 11/28/2011 08:07:19 ET JOB#: [id number]  cc: Sean Macwilliams P. Benjie Karvonen, MD, <Dictator> Marguerita Merles, MD Donell Beers Theadore Blunck MD ELECTRONICALLY SIGNED 11/28/2011 11:41

## 2014-12-10 NOTE — Consult Note (Signed)
PATIENT NAME:  Roger Moore, OLIVERA MR#:  [id number] DATE OF BIRTH:  1945-12-08  DATE OF CONSULTATION:  01/06/2012  REFERRING PHYSICIAN:   CONSULTING PHYSICIAN:  Jeyren Danowski D. Clayborn Bigness, MD  PRIMARY CARE PHYSICIAN: Delight Stare, MD   INDICATION: Tachycardia, palpitations, abnormal EKG.  HISTORY OF PRESENT ILLNESS: Roger Moore is a 69 year old white male with a history of respiratory failure, aneurysm, appendectomy, congestive heart failure, chronic obstructive pulmonary disease, hypercholesterolemia, hypertension, and seizures who presented with benign prostatic hypertrophy requiring surgery. He underwent surgical procedure and during the surgery and while in the CCU he had episode of tachycardia, possible atrial flutter. No chest pain. Cardiology consultation was recommended for further evaluation. He has had acute renal failure secondary to benign prostatic hypertrophy, urinary tract infection, inflammatory response, bladder mass, chronic obstructive pulmonary disease, hypertension, hyperlipidemia, altered mental status, palpitations. He had denied any up until now. He had been doing reasonably well except for urological problems.   REVIEW OF SYSTEMS: No blackout spells or syncope. No nausea or vomiting. No fever. No chills. No sweats. No weight loss. No weight gain. No hemoptysis or hematemesis. No bright red blood per rectum. He's had urinary retention, hematuria, and some renal insufficiency.   FAMILY HISTORY: Noncontributory except for hypertension and hyperlipidemia. He has some skin cancer in his family.   SOCIAL HISTORY: Occasional alcohol abuse but quit years ago. Quit smoking last year. Retired.   PAST MEDICAL HISTORY:  1. Chronic renal insufficiency. 2. Urinary retention. 3. Benign prostatic hypertrophy. 4. Bladder cancer. 5. Hydronephrosis.  6. Skin cancer. 7. Hypertension. 8. Depression. 9. Elevated PSA. 10. Chronic obstructive pulmonary disease. 11. Congestive heart  failure. 12. Tachycardia. 13. Renal insufficiency. 14. Altered mental status.   MEDICATIONS:  1. Advair Diskus 250/50 twice a day.  2. Amlodipine 5 a day.  3. Aspirin 325 daily.  4. Combivent 2 puffs q.6 hours p.r.n.  5. Finasteride 5 mg once a day.  6. Lasix 20 mg a day. 7. Imipramine 50 mg a day.  8. Lovastatin 20 mg a day. 9. Metoprolol 25 twice a day.  10. Pulmicort twice a day.  11. Rapaflo 8 mg once a day. 12. Spiriva twice a day.  13. Symbicort 160/4.5 twice a day.  14. Trazodone 100 mg at bedtime. 15. Alprazolam 0.25 every six hours p.r.n.  16. Protonix 40 a day.  17. Tegretol 200 mg twice a day.   ALLERGIES: None.   PHYSICAL EXAMINATION:   VITAL SIGNS: Blood pressure 125/70, pulse 70, respiratory rate 16, afebrile.   HEENT: Normocephalic, atraumatic. Pupils equal and reactive to light.   NECK: Supple. No JVD or lymphadenopathy.   LUNGS: Clear to auscultation and percussion. No significant wheeze, rhonchi, or rales. ''  HEART: Regular rate and rhythm. Systolic ejection murmur left sternal border. PMI nondisplaced. Positive S4.   ABDOMEN: Positive bowel sounds. Benign.   EXTREMITIES: Within normal limits.   NEUROLOGIC: Intact.   SKIN: Normal.  GU: The patient has an indwelling Foley catheter in place.  LABORATORY, DIAGNOSTIC, AND RADIOLOGICAL DATA: Most recent labs include creatinine 1.3. CBC is pending. Magnesium is pending. Met B is pending.   EKG normal sinus rhythm, nonspecific findings, possible atrial flutter versus artifact.   White count in the past 14.3, hemoglobin and hematocrit 9.6 and 30.4, platelet count of 350, sodium 141, potassium 3.7, chloride 105, BUN 21, glucose 120.   ASSESSMENT:  1. Tachycardia, possible atrial flutter. 2. Chronic obstructive pulmonary disease. 3. History of congestive heart failure. 4. Abnormal EKG. 5.  Hypertension. 6. Hyperlipidemia. 7. Urinary retention. 8. Seizure disorder. 9. Melanoma.   PLAN:   1. Agree with overnight observation.  2. Continue Foley catheter.  3. I do not recommend telemetry at this point.  4. Continue current therapy.  5. Will hold off on beta-blockers because of his chronic obstructive pulmonary disease. 6. Just continue to follow him. This may be all postanesthesia.   7. No direct therapy is necessary. The patient states he was asymptomatic from his tachycardia and possible atrial flutter. Will probably treat the patient medically for now.  8. Continue postop care.  9. Hypertension control. 10. Lipid management. 11. Respiratory care with inhalers.  12. Will treat the patient conservatively unless symptoms persist, worsen, or recur   ____________________________ Loran Senters. Clayborn Bigness, MD ddc:drc D: 01/06/2012 15:22:34 ET T: 01/06/2012 15:44:34 ET JOB#: [id number]  cc: Mistee Soliman D. Clayborn Bigness, MD, <Dictator> Yolonda Kida MD ELECTRONICALLY SIGNED 01/08/2012 11:35

## 2014-12-10 NOTE — Discharge Summary (Signed)
PATIENT NAME:  Roger Moore, Roger Moore MR#:  [id number] DATE OF BIRTH:  Jun 30, 1946  DATE OF ADMISSION:  11/28/2011 DATE OF DISCHARGE:  12/02/2011  For a detailed note, please take a look at the history and physical done by Dr. Bettey Costa on admission.   DISCHARGE DIAGNOSES:   1. Acute renal failure secondary to urinary tract infection and urinary retention.  2. Systemic inflammatory response syndrome secondary to urinary tract infection.  3. A suspected bladder mass.  4. Chronic obstructive pulmonary disease. 5. Hypertension.  6. Hyperlipidemia.  7. Altered mental status secondary to urinary tract infection.   DIET: The patient is being discharged on a low-sodium, low-fat diet.   ACTIVITY: As tolerated.   FOLLOW-UP: Follow-up in the next 1 to 2 weeks with Dr. Delight Stare and also Dr. Edrick Oh.   DISCHARGE MEDICATIONS:  1. Lovastatin 20 mg daily.  2. Rapaflo 8 mg daily.  3. Spiriva 1 puff daily.  4. Advair 250/50 1 puff b.i.d.  5. Xanax 0.25 mg q.6 hours as needed. 6. Pulmicort b.i.d.  7. Protonix 40 mg daily.  8. Trazodone 100 mg at bedtime as needed.  9. Aspirin 325 mg daily.  10. Zyrtec 10 mg daily.  11. Symbicort 2 puffs b.i.d.  12. Combivent 2 puffs q.i.d. as needed. 13. Metoprolol titrate 25 mg b.i.d.  14. Tegretol 200 mg b.i.d.  15. Imipramine 200 mg daily. 16. Amlodipine 5 mg daily.  17. Levaquin 500 mg daily x7 days. 18. Finasteride 5 mg daily.   STOP MEDICATIONS: The patient is to discontinue his Lasix for now.   CONSULTANTS DURING HOSPITAL COURSE: Dr. Irene Shipper and Dr. Edrick Oh from urology.   PERTINENT STUDIES DONE DURING THE HOSPITAL COURSE:  A chest x-ray done on admission showed interstitial prominence which may represent interstitial infiltrate. No focal consolidations noted. A KUB done showing nonspecific bowel gas pattern, early ileus suspected. An ultrasound of the kidneys showing severe hydronephrosis, left nephrolithiasis, probable bladder mass with  cholelithiasis.   Urine cultures suggestive of contamination. Blood cultures positive for staph epidermidis, which is a contaminant to.   HOSPITAL COURSE: This is a 69 year old male with medical problems as mentioned above presented to the hospital on 11/28/2011 secondary to altered mental status, confusion, and systemic inflammatory response syndrome.   1. Acute renal failure. The patient presented to the hospital with a creatinine of 2.9 and a BUN of 63. Baseline creatinine is around 1 and this was therefore acute in nature. It was thought to be related to urinary tract infection and sepsis. The patient was aggressively hydrated with IV fluids and started on empiric antibiotics for the urinary tract infection. Renal ultrasound was obtained which showed bilateral hydronephrosis with suspected bladder mass. Therefore a urology consult was obtained. The patient was seen by Dr. Irene Shipper. The patient had a Foley placed urgently and after having the Foley placed the patient did diuresis very well. He currently is being discharged with the Foley catheter. His creatinine is now back down to 1.2 and his urine output has been fairly good. As per Dr. Wenda Overland evaluation, the patient did have a very significantly enlarged prostate, therefore on top of the Rapaflo the patient was also given finasteride. He is being discharged on both of them with follow-up with Dr. Jacqlyn Larsen as an outpatient.   2. Altered mental status and encephalopathy. This was secondary to the urinary tract infection. The patient's mental status is now back down to baseline as his renal failure and urinary tract infection has  been treated. He will finish therapy for his urinary tract infection with the Levaquin as stated.   3. Systemic inflammatory response syndrome. When the patient presented to the hospital he had a fever of 102 and was hypotensive. Therefore the patient was started on IV fluids and empiric IV antibiotics. The patient's  hemodynamics after treatment with fluids and antibiotics have significantly improved and he is currently being discharged on antibiotics as mentioned. He was treated with IV Levaquin and empirically with IV vancomycin as his blood cultures were positive for gram-positive cocci, but since his blood cultures have come back to be consistent with contamination due to staph epidermidis, he is currently being discharged on Levaquin just for his urinary tract infection.   4. Chronic obstructive pulmonary disease. The patient did not have any evidence of acute chronic obstructive pulmonary disease exacerbation. He will resume his Symbicort, Spiriva, and Pulmicort nebulizers. He will follow-up as an outpatient with his primary care physician.   5. Gastroesophageal reflux disease. The patient was continued on his Protonix. He will resume that.   6. Hyperlipidemia. The patient was maintained on his lovastatin and he will resume that upon discharge.   The patient is being discharged with home health nursing and physical therapy services. The patient did have a suspicion for possible bladder mass as mentioned on the renal ultrasound but the patient is going to follow-up with urology with Dr. Jacqlyn Larsen who plans on doing an outpatient cystoscopy to further evaluate this.   TIME SPENT:  Time spent with the discharge was 40 minutes. ____________________________ Belia Heman. Verdell Carmine, MD vjs:rbg D: 12/02/2011 17:03:47 ET T: 12/04/2011 09:26:56 ET JOB#: [id number]  cc: Marguerita Merles, MD Denice Bors. Jacqlyn Larsen, MD Henreitta Leber MD ELECTRONICALLY SIGNED 12/09/2011 12:18

## 2014-12-10 NOTE — Op Note (Signed)
PATIENT NAME:  Roger Moore, Roger Moore MR#:  [id number] DATE OF BIRTH:  Nov 21, 1945  DATE OF PROCEDURE:  01/06/2012  PREOPERATIVE DIAGNOSIS: Benign prostatic hypertrophy with median lobe formation, urinary retention, bladder mass.   POSTOPERATIVE DIAGNOSIS: Benign prostatic hypertrophy with median lobe formation, urinary retention, bladder mass.   PROCEDURE: Transurethral resection of the prostate, bladder biopsy.   SURGEON: Denice Bors. Jacqlyn Larsen, MD   ANESTHESIA: Spinal.   INDICATIONS: The patient is a 69 year old gentleman who recently presented to [facility] with multiple medical issues. He was found to be in urinary retention. He has failed voiding trials with medical management. He underwent recent cystoscopy which demonstrated prominent trilobar hypertrophy with intravesical protrusion of the prostate tissue. Prominent inflammatory changes were noted throughout the bladder. The was an approximate 1 cm solid mass on the anterior bladder wall. He presents for transurethral resection of the prostate for the urinary retention and biopsy of the bladder mass.   PROCEDURE: After informed consent was obtained, the patient was taken to the operating room and placed in the dorsal lithotomy position under spinal anesthesia with adequate levels. The patient was then prepped and draped in the usual standard fashion. The 84 French rigid cystoscope was introduced into the urethra under direct vision with no urethral abnormalities noted. Upon entering the prostate, the verumontanum was easily identified. Prominent bilobar hypertrophy was visualized with complete visual obstruction. As the scope was advanced through the prostatic urethra, prominent bilobar hypertrophy was once again noted at the level of the bladder neck. There was an area that appeared to have stone within a large lateral lobe piece of tissue on the left. A large intravesical right lateral lobe with some median lobe formation was also noted. Some anterior tissue was  also present. Prominent inflammation was noted around the prostate tissue at the bladder neck due to the presence of an indwelling Foley catheter for several weeks. Inflammatory changes were also noted on the bladder base and posterior bladder wall consistent with Foley catheterization. There was an approximate 1 cm area on the anterior bladder wall that had some indication of an inflammatory process. There appeared to be some cystic component on its upper most aspect. The base, however, did appear to be solid. Cold cup biopsy forceps were utilized to remove two large pieces of the tissue. The base was cauterized utilizing electrocautery. The resectoscope sheath was placed. Then utilizing the visual obturator the resectoscope loop was placed through the scope. Resection was begun first on the left at the area of the apparent stone within the prostate tissue. Some stone was present. Large areas of cavitary regions were noted throughout the lateral lobe tissue with large amounts of thick purulent material. The resection was taken down to the level of the bladder neck. Resection was also undertaken on the right down to the level of the bladder neck. Bilateral ureteral orifices were visualized. They were an adequate distance from the bladder neck region. Prominent trabeculation was noted throughout the entire bladder. The resection was then taken back to the level of the verumontanum centrally. Additional lateral lobe tissue was taken down utilizing the loop. Some anterior and intravesical tissue was also taken down utilizing the loop. The area was extensively cauterized utilizing the loop initially. The chips were irrigated from the bladder. Attention was then turned to the button electrode. Additional resection was undertaken of the lateral and some anterior tissue utilizing the button electrode. Extensive cauterization was undertaken with the button. Overall minimal bleeding was encountered. The remaining prostate  chips were irrigated free from the bladder. The prostate fossa was noted to be more than adequately open. Once the resection was completed, the resectoscope was removed and a 22 [place] three-way Foley catheter was placed utilizing a catheter guide to gravity drainage and to continuous bladder irrigation without difficulty. Once again, minimal bleeding was encountered. The total blood loss for the case was probably less than 100 mL. The prostate chips were collected and will be sent for pathology analysis. The patient was returned to the supine position. He was taken to the recovery room in stable condition. There were no problems or complications. The patient tolerated the procedure well.    ____________________________ Denice Bors. Jacqlyn Larsen, MD bsc:drc D: 01/06/2012 10:22:15 ET T: 01/06/2012 10:34:53 ET JOB#: [id number]  cc: Denice Bors. Jacqlyn Larsen, MD, <Dictator> Denice Bors Carlene Bickley MD ELECTRONICALLY SIGNED 01/06/2012 20:59

## 2014-12-10 NOTE — Consult Note (Signed)
CC: ARF, Hydronephrosis 69 yo male admitted for ARF, AMS and fever suspected to be urosepsis.  Ultrasound was done that showed bilateral hydronephrosis with bladder distention and a mass within the bladder.  Urology was consulted is a poor historian and most of his medical history is obtained from the medical record.  He reports that he has seen a urologist in the past for a left hydrocele.  When asked about his voiding habits, he denies nocturia and voids 5-8 times/day.  He denies any hematuria, dysuria, suprapubic pain or other GU related symptoms.  He denies flank pain. : 1. Chronic obstructive pulmonary disease, on 2 liters of oxygen.  Hyperlipidemia.  3. Hypertension.   BPH  1. Advair Diskus 250/50 b.i.d.  2. Spiriva 18 mcg daily.  Xanax 0.25 mg q.6 hours p.r.n.  Pulmicort 0.25 mg.  Protonix 40 mg daily.  Aspirin 325 daily.  7. Rapaflo 8 mg daily. Zyrtec 10 mg daily.  Symbicort 160/4.5 daily.  Lasix 20 mg daily.  Metoprolol 12.5 b.i.d.  Tegretol 200 mg b.i.d.  Lovastatin 20 mg daily.  Norvasc 5 mg daily.   ALLERGIES: No known drug allergies.   1. Appendectomy.  2. 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  Positive for coronary artery disease, hypertension, and diabetes.  The patient quit smoking about six months ago. No alcohol or IV drug use.  CONSTITUTIONAL: Positive fever. Positive fatigue and weakness. No weight loss or gain. EYES: No blurred or double vision. He has a mass growing underneath the left eye which he is seeing a dermatologist for. RESPIRATORY: No cough, wheezing, or hemoptysis. Positive COPD on 2 liters oxygen. CARDIOVASCULAR: Denies any chest pain, syncope, or palpitations. GI: No nausea, vomiting, diarrhea, abdominal pain. Positive abdominal distention. GU: No dysuria or hematuria. ENDOCRINE: No thyroid problems, increased sweating. HEME/LYMPH: No easy bruising. SKIN: No rashes or lesions. He does have a mass growing from the left side. He has  conjunctival discharge on the left side. MUSCULOSKELETAL: No limited activity. NEUROLOGIC: No history of CVA or TIA. PSYCH: No history of anxiety or depression. 38.7/Tc98.5  Pulse : 106  Respirations : 18 BP : 193  Diastolic BP (mmHg) : 71  Mean BP : 98 mm Hg Pulse Ox % : 96 Ox Activity Level  : At restDelivery : 2L Gen: NADmass under left eye, wearing glassesunlabored breathingRR, tachydistended, no tenderness with palpation. Suprapubic fullness, palpable bladderlarge left hydrocele.  right testicle wnluncircumcised, no masses or lesions>60gm prostate, no induration 2.98-->2.722-->173+leuk esterase, 174 wbc/hpf, nitrite negativemixed flora  RESULT: The right kidney measures 12.3 cm x 7.0 cm x 6.3 cm and the left measures 13.1 cm x 6.6 cm x 6.4 cm. There is observed marked hydronephrosis. In the left kidney there are noted multiple compatible with renal stones. No solid renal mass lesions identified. There are noted multiple echodensities in the urinary The largest appears to represent a soft tissue mass that 3.86 cm at maximum diameter. The additional densities may small polypoid lesions or debris within the bladder. Poorly stones could produce similar findings. ureteral flow jets are observed in the bladder. note is made of multiple echodensities in the gallbladder with gallstones. Severe hydronephrosis is observed bilaterally.Left nephrolithiasis.Probable bladder mass.Cholelithiasis. patient was prepped and draped in sterile fashion.  67F foley inserted into bladder with return >1500cc yellow urine.  balloon inflated with 10cc sterile water. patient tolerated procedure well 69 year male admitted for ARF, AMS and fevers. Hydronephrosis on u/s and urinary retention now with  foley catheterHydronephrosis-  With bilateral hydronephrosis the most likely etiology is bladder outlet obstruction.  Foley was inserted with large volume of urine in bladder. Trend creatinine and monitor for post obstructive  diuresis with lytes.  If creatinine improves, can get repeat u/s in next few days to look for resolution of hydronephrosisUrinary retention- continue rapaflo and start finasteride for bladder outlet obstruction.  Continue foley catheter for next several daysBladder mass- May be worked up as outpatient with outpatient cystoscopy.  Will need to discuss with family plan for careARF- If creatinine does not improve with foley catheter, may need to consider obstruction at ureteral level. May need to consider CT scan and then stents vs. nephrostomy tubes can be discussed at that time.UTI- continue abx for complicated UTI course.  f/u cultures   discussed with Dr. Verdell Carmine    Electronic Signatures: Margy Clarks (MD)  (Signed on 13-Apr-13 14:35)  Authored  Last Updated: 13-Apr-13 14:35 by Margy Clarks (MD)

## 2014-12-17 NOTE — H&P (Signed)
PATIENT NAME:  Roger Moore, DESKINS MR#:  [id number] DATE OF BIRTH:  Jun 13, 1946  DATE OF ADMISSION:  11/10/2014  PRIMARY CARE PHYSICIAN: Marguerita Merles, MD   PRIMARY CARDIOLOGIST: Dwayne D. Genesee, MD  PRIMARY PULMONOLOGIST: [place] Raul Del, MD  REFERRING EMERGENCY ROOM PHYSICIAN: Algis Liming. Jimmye Norman, MD   CHIEF COMPLAINT: Shortness of breath.   HISTORY OF PRESENTING ILLNESS: A 69 year old male who has a past history of chronic obstructive pulmonary disease. He is on 3 L oxygen at home all the time; hyperlipidemia; hypertension; bipolar disease; CHF; and macular degeneration; who lives with his brother and able to walk without any support but gets extremely short of breath on minimal exertion at his baseline. He today, after dinner, started feeling severe shortness of breath and his 3 L oxygen was not enough. His brother checked his oxygen level. He said that it was 70, but patient started getting confused and so he decided to call to ambulance and EMS brought him to the Emergency Room. In ER, he was noted to be slightly hypercapnic and started on BiPAP machine. He felt comfortable on BiPAP and so given to hospitalist team for further management of this issue.  REVIEW OF SYSTEMS:  CONSTITUTIONAL: Negative for fever, fatigue, weakness. As per brother, he is losing weight for the last 1 month. No weight gain.  EYES: No blurring, double vision, discharge, or redness.  EARS, NOSE, THROAT: No tinnitus, ear pain, or hearing loss.  RESPIRATORY: The patient has wheezing but no cough.  CARDIOVASCULAR: No chest pain, orthopnea, edema, arrhythmia, palpitation.  GASTROINTESTINAL: No nausea, vomiting, diarrhea, abdominal pain.  GENITOURINARY: No dysuria, hematuria, increased frequency.  ENDOCRINE: No heat or cold intolerance. No excessive sweating.  SKIN: No acne, rashes, or lesions.  MUSCULOSKELETAL: No pain or swelling in the joints.  NEUROLOGICAL: No numbness, weakness, tremor, or vertigo.   PSYCHIATRIC: No anxiety, insomnia, bipolar disorder.   PAST MEDICAL HISTORY:  1.  Chronic obstructive pulmonary disease on 3 L oxygen at home.  2.  Hyperlipidemia.  3.  Hypertension.  4.  Bipolar disease.  5.  CHF.  6.  Losing weight for the last 1 month, almost 4 to 5 pounds.  7.  Macular degeneration.   PAST SURGICAL HISTORY:  1.  Appendectomy.  2.  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  3.  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   FAMILY HISTORY: Positive for coronary artery disease in both parents, died because of MI. Also positive for hypertension and diabetes in family.   SOCIAL HISTORY: He was a smoker chronically. He quit smoking in 2012. No drinking alcohol. No illegal drug use. He does not require any support to walk, lives with brother.  HOME MEDICATIONS: 1.  Zolpidem 5 mg oral once a day.  2.  Trazodone 100 mg oral once a day.  3.  Spiriva 18 mcg inhalation once a day.  4.  Pantoprazole 40 mg once a day.  5.  Metoprolol 25 mg 2 times a day.  6.  Lovastatin 20 mg once a day.  7.  Keppra 500 mg 2 times a day.  8.  Latanoprost ophthalmic drops, 1 drop each affected eye once a day in the evening.  9.  Hydrochlorothiazide 12.5 mg once a day.  10.  Finasteride 5 mg oral once a day.  11.  Famotidine 20 mg 2 times a day.  12.  Combivent 2 puffs inhalation 4 times a day.  13.  Cetirizine 10 mg oral once a day.  14.  Carbamazepine 200 mg 2 times a day.  15.  Aspirin 81 mg once a day.  16.  Amlodipine 5 mg once a day.  17.  Alprazolam 0.25 mg every 6 hours as needed for anxiety.  18.  Advair Diskus 250 mcg 1 puff inhalation 2 times a day.   PHYSICAL EXAMINATION:  VITAL SIGNS: In ER, temperature 98.2, pulse rate 88, respirations 24, blood pressure 124/66, and pulse oximetry 80% on room air; and after starting on BiPAP, it came to 98%.  GENERAL: The patient is slightly drowsy. He is on BiPAP and he opens eyes to gentle stimuli and call, but preferred to go back to sleep and does  not communicate much.  HEENT: Head and neck atraumatic. Appears to be disheveled. Conjunctivae pink. Sclerae anicteric. Oral mucosa moist.  NECK: Supple. No JVD. Thyroid nontender.  RESPIRATORY: Bilateral decreased air entry. Barrel-shaped chest and very little movement of the chest with respirations. Mild wheezing present.  CARDIOVASCULAR: S1, S2 present, regular. No murmur.  ABDOMEN: Soft, nontender. Bowel sounds present. No organomegaly.  SKIN: No acne, rashes, or lesions.  MUSCULOSKELETAL: No tenderness or swelling in the joints. Legs: No edema.  NEUROLOGICAL: He is moving all 4 limbs. Power is 3 to 4 out of 5 with generalized weakness. No tremor or rigidity. Sensation appears intact.  PSYCHIATRIC: Does not appear in any acute psychiatric illness.  IMPORTANT LABORATORY RESULTS: At this time: 1.  Chest x-ray, portable, showed area of scar bilaterally. No acute cardiopulmonary disease. 2.  BNP is 109, glucose 260, BUN 27, creatinine 1.10, sodium 140, potassium is 3.9, chloride 87, CO2 of 49, calcium 8.3. 3.  WBC 7, hemoglobin 11.8, platelet count 232,000, and MCV 94. 4.  On VBG, pH is 7.41 and pCO2 is 98.   ASSESSMENT AND PLAN: A 69 year old male with a past history of chronic obstructive pulmonary disease on home oxygen came with severe worsening of respiratory status and was found hypoxic. Started on BiPAP.  1.  Acute on chronic respiratory failure. The patient is currently on BiPAP at home. He is on 3 L oxygen supplementation. Will repeat his ABG to check correction with his BiPAP if not. Discussed with patient's daughter, and she agrees that the patient is a full code and she would like to have everything to be done for him.  2.  Acute exacerbation of chronic obstructive pulmonary disease. Will continue BiPAP, give him intravenous steroids, Levaquin, and nebulizer therapy.  3.  Hyperlipidemia. Continue lovastatin.  4.  Hypertension. Continue metoprolol.  5.  Bipolar. Continue  trazodone.  CODE STATUS: Full code. The patient's healthcare power-of-attorney is his daughter.   CONDITION: Critical. I explained to her about the possibility of worsening respiratory failure and requirement of intubation. She understands and agrees for that and wants everything to be done for him in any adverse event.  TOTAL TIME SPENT IN CRITICAL CARE: 60 minutes in this admission.   ____________________________ Ceasar Lund Anselm Jungling, MD vgv:ST D: 11/10/2014 21:50:39 ET T: 11/10/2014 23:21:21 ET JOB#: [id number]  cc: Ceasar Lund. Anselm Jungling, MD, <Dictator> Marguerita Merles, MD Herbon E. Raul Del, MD Rosalio Macadamia [facility] MD ELECTRONICALLY SIGNED 11/23/2014 0:26

## 2014-12-17 NOTE — Discharge Summary (Signed)
PATIENT NAME:  Roger Moore, Roger Moore MR#:  [id number] DATE OF BIRTH:  Nov 07, 1945  DATE OF ADMISSION:  11/10/2014 DATE OF DISCHARGE:  11/12/2014  PRESENTING COMPLAINT: Shortness of breath.   DISCHARGE DIAGNOSES: 1.  Chronic obstructive pulmonary disease exacerbation.  2.  Chronic respiratory failure, on home oxygen.   CONDITION AT DISCHARGE: Saturation 92% to 93% on 3 liters.  CODE STATUS: FULL.   DISCHARGE MEDICATIONS: 1.  Trazadone 100 mg p.o. daily at bedtime.  2.  Cetirizine 10 mg daily.  3.  Hydrochlorothiazide 12.5 mg p.o. daily.  4.  Ambien 5 mg once a day at bedtime as needed.  5.  Xanax 0.25 p.o. q. 6 hourly as needed.  6.  Amlodipine 5 mg daily.  7.  Aspirin 81 mg daily.  8.  Carbamazepine 200 mg extended-release b.i.d.  9.  Famotidine 20 mg b.i.d.  10.  Finasteride 5 mg daily.  11.  Advair Diskus 250/50 one puff b.i.d.  12.  Hydrocortisone apply to effected area b.i.d.  13.  Combivent 100/20 mcg per inhalation 2 puffs 4 times a day as needed.  14.  Latanoprost 0.005% one drop to effected eye daily.  15.  Keppra 500 mg 1 tablet b.i.d.  16.  Metoprolol 25 mg b.i.d.  17.  Protonix 40 mg daily.  18.  Spiriva 18 mcg inhalation daily.  19.  Lovastatin 20 mg daily.  20.  Prednisone taper.  21.  Levaquin 500 mg daily.   DISCHARGE INSTRUCTIONS: 1.  Continue your home oxygen. 2.  Home health RN.  3.  Follow up with Dr. Raul Del in 2 to 4 weeks.  4.  Follow up with Dr. Delight Stare in 1 to 2 weeks.   BRIEF SUMMARY OF HOSPITAL COURSE: Roger Moore is pleasant 69 year old Caucasian gentleman with chronic respiratory failure, on home oxygen, who comes in with worsening respiratory status found to be hypoxic, hypercarbic and started on BiPAP. He was admitted with:  1.  Acute on chronic respiratory failure. Off BiPAP. On 3 liters nasal cannula oxygen. He was started on IV steroids, changed to p.o. steroids and continued nebs and antibiotics. The patient will complete his course of  antibiotics as outpatient.  2.  Acute on chronic COPD exacerbation. P.o. steroids, Levaquin and nebulizer therapy.  3.  Hyperlipidemia. Continue Lovastatin.  4.  Hypertension. Continue metoprolol.  5.  History of bipolar disorder, stable.   Hospital stay otherwise remained stable. The patient's discharge plan was discussed with his brother on the phone.   TIME SPENT: 40 minutes.  ____________________________ Hart Rochester Posey Pronto, MD sap:sb D: 11/13/2014 07:06:55 ET T: 11/13/2014 09:05:25 ET JOB#: [id number]  cc: Deloros Beretta A. Posey Pronto, MD, <Dictator> Ilda Basset MD ELECTRONICALLY SIGNED 11/20/2014 12:25

## 2015-01-22 ENCOUNTER — Encounter: Payer: Self-pay | Admitting: Psychiatry

## 2015-01-22 ENCOUNTER — Other Ambulatory Visit: Payer: Self-pay

## 2015-01-22 ENCOUNTER — Ambulatory Visit (INDEPENDENT_AMBULATORY_CARE_PROVIDER_SITE_OTHER): Payer: 59 | Admitting: Psychiatry

## 2015-01-22 ENCOUNTER — Ambulatory Visit: Payer: Self-pay | Admitting: Psychiatry

## 2015-01-22 VITALS — BP 140/82 | HR 78 | Temp 98.3°F | Ht 65.0 in | Wt 127.4 lb

## 2015-01-22 DIAGNOSIS — F0789 Other personality and behavioral disorders due to known physiological condition: Secondary | ICD-10-CM | POA: Diagnosis not present

## 2015-01-22 DIAGNOSIS — F09 Unspecified mental disorder due to known physiological condition: Secondary | ICD-10-CM | POA: Diagnosis not present

## 2015-01-22 MED ORDER — ALPRAZOLAM 0.5 MG PO TABS: 0.5000 mg | ORAL_TABLET | Freq: Three times a day (TID) | ORAL | Status: DC | PRN

## 2015-01-22 MED ORDER — SERTRALINE HCL 50 MG PO TABS: 50.0000 mg | ORAL_TABLET | Freq: Every day | ORAL | Status: DC

## 2015-01-22 MED ORDER — CARBAMAZEPINE 200 MG PO TABS: 200.0000 mg | ORAL_TABLET | Freq: Two times a day (BID) | ORAL | Status: DC

## 2015-01-22 MED ORDER — MIRTAZAPINE 15 MG PO TABS: 15.0000 mg | ORAL_TABLET | Freq: Every day | ORAL | Status: DC

## 2015-01-22 NOTE — Progress Notes (Signed)
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 He presented his brother as he has for all of his previous appointments. Brother commented that he believes that the new medication, sertraline kicked in about a week ago. Brother states that this is based on the fact that the patient has been sleeping better. In particular the patient is no longer sleeping during the day but sleeps at night. Patient states that his nerves may be a little bit better as well. He denies any side effects from medication. He said he is not depressed. When I asked what he does with this time he states that he prays a lot and that he does this also in regards to his anxiety. He states his anxiety is not worsened and might be slightly better.  Stress within the results of the carbamazepine level which was normal at 4.2. I did discuss with patient and brother that the patient's hemoglobin and hematocrit were low at 12.1 and 37.7 respectively. However review of the chart indicate that these have been low 1 year ago. Brother recalled that primary care had referred the patient for workup in regards to this level but nothing had turned up. Given that this is stable patient will continue to have follow-up with his primary care. 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 CHF (congestive heart failure)   . Hypertension   . COPD (chronic obstructive pulmonary disease)     Past Surgical History  Procedure Laterality Date  . Kidney stone surgery  2 years ago  . Bladder surgery     Family History:  Family History  Problem Relation Age of Onset  . Hypertension Mother   . Hypertension Father   . Heart attack Father    Social History:  History   Social History   . Marital Status: Single    Spouse Name: N/A  . Number of Children: N/A  . Years of Education: N/A   Social History Main Topics  . Smoking status: Former Smoker    Quit date: 01/22/2008  . Smokeless tobacco: Former Systems developer    Quit date: 01/22/2008  . Alcohol Use: No  . Drug Use: No  . Sexual Activity: Not Currently   Other Topics Concern  . None   Social History Narrative   Additional History: None  Assessment:   Musculoskeletal: Strength & Muscle Tone: Patient ambulates with a portable oxygen tank and slightly slow gait. But grossly normal motor strength. Gait & Station: Slow  Patient leans: N/A  Psychiatric Specialty Exam: HPI  ROS  Blood pressure 140/82, pulse 78, temperature 98.3 F (36.8 C), temperature source Tympanic, height 5\' 5"  (1.651 m), weight 127 lb 6.4 oz (57.788 kg), SpO2 92 %.Body mass index is 21.2 kg/(m^2).  General Appearance: Fairly Groomed  Eye Contact:  Fair  Speech:  Response appropriately, brief concrete answers  Volume:  Normal  Mood:  "Pretty good"  Affect:  Smiling  Thought Process:  Concrete  Orientation:  Full (Time, Place, and Person)  Thought Content:  Negative  Suicidal Thoughts:  No  Homicidal Thoughts:  No  Memory:  Immediate;   Good Recent;   Fair Remote;   Poor  Judgement:  Fair  Insight:  Fair  Psychomotor Activity:  Negative  Concentration:  Fair  Recall:  Poor  Fund of Knowledge: Poor  Language: Fair  Akathisia:  Negative  Handed:  Right  AIMS (if indicated):  Not done  Assets:  Social Support  ADL's:  Impaired  Cognition: Impaired,  Severe  Sleep:  Good as above   Is the patient at risk to self?  No. Has the patient been a risk to self in the past 6 months?  No. Has the patient been a risk to self within the distant past?  No. Is the patient a risk to others?  No. Has the patient been a risk to others in the past 6 months?  No. Has the patient been a risk to others within the distant past?  No.  Current  Medications: Current Outpatient Prescriptions  Medication Sig Dispense Refill  . albuterol (PROVENTIL) (2.5 MG/3ML) 0.083% nebulizer solution Inhale into the lungs as needed. 1 every six hours, as needed    . albuterol-ipratropium (COMBIVENT) 18-103 MCG/ACT inhaler Inhale into the lungs as needed.    . ALPRAZolam (XANAX) 0.5 MG tablet Take by mouth as needed. 1 am , 1 pm and 2 qhs tablets three times daily, as needed for 30 days    . amLODipine (NORVASC) 5 MG tablet Take 1 tablet by mouth daily.    Marland Kitchen aspirin EC 81 MG tablet Take 1 tablet by mouth daily.    . carbamazepine (TEGRETOL XR) 200 MG 12 hr tablet Take 1 tablet by mouth 2 (two) times daily.    . cetirizine (ZYRTEC) 10 MG tablet Take 1 tablet by mouth daily.    . famotidine (PEPCID) 20 MG tablet Take 1 tablet by mouth 2 (two) times daily.    . finasteride (PROSCAR) 5 MG tablet Take 1 tablet by mouth daily.    . Fluticasone-Salmeterol (ADVAIR) 250-50 MCG/DOSE AEPB Inhale into the lungs every 12 (twelve) hours.    . hydrochlorothiazide (HYDRODIURIL) 12.5 MG tablet Take 1 tablet by mouth daily.    . hydrocortisone cream 1 % Apply topically 2 (two) times daily.    Marland Kitchen latanoprost (XALATAN) 0.005 % ophthalmic solution Apply to eye daily.    Marland Kitchen levETIRAcetam (KEPPRA) 500 MG tablet Take 1 tablet by mouth 2 (two) times daily.    Marland Kitchen lovastatin (MEVACOR) 20 MG tablet Take 1 tablet by mouth daily. With dinner    . metoprolol tartrate (LOPRESSOR) 25 MG tablet Take 1 tablet by mouth 2 (two) times daily.    . pantoprazole (PROTONIX) 40 MG tablet Take 1 tablet by mouth daily.    . predniSONE (DELTASONE) 10 MG tablet Take 1 tablet by mouth daily.    Marland Kitchen tiotropium (SPIRIVA) 18 MCG inhalation capsule Place into inhaler and inhale daily.    Marland Kitchen amLODipine (NORVASC) 10 MG tablet     . azithromycin (ZITHROMAX) 250 MG tablet     . carbamazepine (TEGRETOL) 200 MG tablet     . esomeprazole (NEXIUM) 40 MG capsule     . hydrochlorothiazide (MICROZIDE) 12.5 MG  capsule     . levofloxacin (LEVAQUIN) 500 MG tablet     . loratadine (CLARITIN) 10 MG tablet Take by mouth.    . mirtazapine (REMERON) 15 MG tablet Take 1 tablet by mouth at bedtime.    Marland Kitchen oxyCODONE-acetaminophen (PERCOCET/ROXICET) 5-325 MG per tablet Take by mouth.    . sertraline (ZOLOFT) 50 MG tablet     . traZODone (DESYREL) 100 MG tablet Take 100 mg by mouth.    Marland Kitchen  zolpidem (AMBIEN) 10 MG tablet      No current facility-administered medications for this visit.    Medical Decision Making:  Established Problem, Stable/Improving (1)  Treatment Plan Summary:Medication management patient is tolerating the sertraline well and appears to have had some benefits and regards his anxiety and sleep. This will continue him on his current regimen of sertraline, carbamazepine, Remeron and alprazolam. Patient will follow up in 2 months. They've encouraged call with any questions or concerns prior to next appointment.   Faith Rogue 01/22/2015, 9:52 AM

## 2015-03-22 ENCOUNTER — Ambulatory Visit: Payer: 59 | Admitting: Psychiatry

## 2015-04-24 ENCOUNTER — Encounter: Payer: Self-pay | Admitting: Psychiatry

## 2015-04-24 ENCOUNTER — Ambulatory Visit (INDEPENDENT_AMBULATORY_CARE_PROVIDER_SITE_OTHER): Payer: 59 | Admitting: Psychiatry

## 2015-04-24 VITALS — BP 130/82 | HR 69 | Temp 98.3°F | Ht 65.0 in | Wt 133.0 lb

## 2015-04-24 DIAGNOSIS — F411 Generalized anxiety disorder: Secondary | ICD-10-CM

## 2015-04-24 DIAGNOSIS — F09 Unspecified mental disorder due to known physiological condition: Secondary | ICD-10-CM

## 2015-04-24 DIAGNOSIS — F0789 Other personality and behavioral disorders due to known physiological condition: Secondary | ICD-10-CM | POA: Diagnosis not present

## 2015-04-24 MED ORDER — ALPRAZOLAM 0.5 MG PO TABS: 0.5000 mg | ORAL_TABLET | Freq: Three times a day (TID) | ORAL | Status: DC | PRN

## 2015-04-24 MED ORDER — CARBAMAZEPINE 200 MG PO TABS: 200.0000 mg | ORAL_TABLET | Freq: Two times a day (BID) | ORAL | Status: DC

## 2015-04-24 MED ORDER — SERTRALINE HCL 50 MG PO TABS: 50.0000 mg | ORAL_TABLET | Freq: Every day | ORAL | Status: DC

## 2015-04-24 MED ORDER — MIRTAZAPINE 15 MG PO TABS: 15.0000 mg | ORAL_TABLET | Freq: Every day | ORAL | Status: DC

## 2015-04-24 NOTE — Progress Notes (Signed)
BH MD/PA/NP OP Progress Note  04/24/2015 10:43 AM Roger Moore  MRN:  [id number]  Subjective:  Patient returns for follow-up of his neurocognitive disorder and anxiety. Presents with his brother who is been at all previous appointments. He states he's doing pretty well. His brother also states he's been doing pretty well. He denies any issues was feeling down and depressed. He states his anxiety continues to be an issue but that is responding to the alprazolam. They pretty much her taking it as scheduled. They deny any side effects.  We discussed his anxiety. He indicated that one of the things that prompts is thinking about the deaths of various relatives. We discussed ways to manage anxiety. He states that he uses prayer.  In regards to activities the patient states that he does talk on the phone with his girlfriend. He is sleeping and eating well.  Patient's anxiety condition is stable. They deny any significant mental status changes.  An states things continue to go well and denies any side effects from his medications.  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 Anxiety state 300.00 F41.1     Past Medical History:  Past Medical History  Diagnosis Date  . CHF (congestive heart failure)   . Hypertension   . COPD (chronic obstructive pulmonary disease)     Past Surgical History  Procedure Laterality Date  . Kidney stone surgery  2 years ago  . Bladder surgery     Family History:  Family History  Problem Relation Age of Onset  . Hypertension Mother   . Hypertension Father   . Heart attack Father   . Diabetes Brother    Social History:  Social History   Social History  . Marital Status: Single    Spouse Name: N/A  . Number of Children: N/A  . Years of Education: N/A   Social History Main  Topics  . Smoking status: Former Smoker    Quit date: 01/22/2008  . Smokeless tobacco: Former Systems developer    Quit date: 01/22/2008  . Alcohol Use: No  . Drug Use: No  . Sexual Activity: Not Currently   Other Topics Concern  . None   Social History Narrative   Additional History: None  Assessment:   Musculoskeletal: Strength & Muscle Tone: Patient ambulates with a portable oxygen tank and slightly slow gait. But grossly normal motor strength. Gait & Station: Slow  Patient leans: N/A  Psychiatric Specialty Exam: Anxiety Symptoms include nervous/anxious behavior (responding well to medication regimen). Patient reports no insomnia or suicidal ideas.    Depression        Associated symptoms include does not have insomnia and no suicidal ideas.  Past medical history includes anxiety.     Review of Systems  Psychiatric/Behavioral: Positive for memory loss. Negative for depression, suicidal ideas, hallucinations and substance abuse. The patient is nervous/anxious (responding well to medication regimen). The patient does not have insomnia.     Blood pressure 130/82, pulse 69, temperature 98.3 F (36.8 C), temperature source Tympanic, height 5\' 5"  (1.651 m), weight 133 lb (60.328 kg), SpO2 93 %.Body mass index is 22.13 kg/(m^2).  General Appearance: Fairly Groomed  Eye Contact:  Fair  Speech:  Response appropriately, brief concrete answers  Volume:  Normal  Mood:  Good  Affect:  Smiling  Thought Process:  Concrete  Orientation:  Full (Time, Place, and Person)  Thought Content:  Negative  Suicidal Thoughts:  No  Homicidal Thoughts:  No  Memory:  Immediate;   Good Recent;   Fair Remote;   Poor  Judgement:  Fair  Insight:  Fair  Psychomotor Activity:  Negative  Concentration:  Fair  Recall:  Poor  Fund of Knowledge: Poor  Language: Fair  Akathisia:  Negative  Handed:  Right  AIMS (if indicated):  Not done  Assets:  Social Support  ADL's:  Impaired  Cognition: Impaired,   Severe  Sleep:  Good as above   Is the patient at risk to self?  No. Has the patient been a risk to self in the past 6 months?  No. Has the patient been a risk to self within the distant past?  No. Is the patient a risk to others?  No. Has the patient been a risk to others in the past 6 months?  No. Has the patient been a risk to others within the distant past?  No.  Current Medications: Current Outpatient Prescriptions  Medication Sig Dispense Refill  . albuterol (PROVENTIL) (2.5 MG/3ML) 0.083% nebulizer solution Inhale into the lungs as needed. 1 every six hours, as needed    . albuterol-ipratropium (COMBIVENT) 18-103 MCG/ACT inhaler Inhale into the lungs as needed.    . ALPRAZolam (XANAX) 0.5 MG tablet Take 1 tablet (0.5 mg total) by mouth 3 (three) times daily as needed. 1 am , 1 pm and 2 qhs tablets three times daily, as needed for 30 days 120 tablet 2  . amLODipine (NORVASC) 5 MG tablet Take 1 tablet by mouth daily.    Marland Kitchen aspirin EC 81 MG tablet Take 1 tablet by mouth daily.    Marland Kitchen azithromycin (ZITHROMAX) 250 MG tablet     . carbamazepine (TEGRETOL XR) 200 MG 12 hr tablet Take 1 tablet by mouth 2 (two) times daily.    . cetirizine (ZYRTEC) 10 MG tablet Take 1 tablet by mouth daily.    . famotidine (PEPCID) 20 MG tablet Take 1 tablet by mouth 2 (two) times daily.    . finasteride (PROSCAR) 5 MG tablet Take 1 tablet by mouth daily.    . fluticasone (FLONASE) 50 MCG/ACT nasal spray Place into the nose.    Marland Kitchen Fluticasone-Salmeterol (ADVAIR) 250-50 MCG/DOSE AEPB Inhale into the lungs every 12 (twelve) hours.    . hydrochlorothiazide (HYDRODIURIL) 12.5 MG tablet Take 1 tablet by mouth daily.    . hydrochlorothiazide (MICROZIDE) 12.5 MG capsule     . hydrocortisone cream 1 % Apply topically 2 (two) times daily.    Marland Kitchen latanoprost (XALATAN) 0.005 % ophthalmic solution Apply to eye daily.    Marland Kitchen levETIRAcetam (KEPPRA) 500 MG tablet Take 1 tablet by mouth 2 (two) times daily.    Marland Kitchen levofloxacin  (LEVAQUIN) 500 MG tablet     . loratadine (CLARITIN) 10 MG tablet Take by mouth.    . lovastatin (MEVACOR) 20 MG tablet Take 1 tablet by mouth daily. With dinner    . metoprolol tartrate (LOPRESSOR) 25 MG tablet Take 1 tablet by mouth 2 (two) times daily.    . mirtazapine (REMERON) 15 MG tablet Take 1 tablet (15 mg total) by mouth at bedtime. 30 tablet 2  . pantoprazole (PROTONIX) 40 MG tablet Take 1 tablet by mouth daily.    . sertraline (ZOLOFT) 50 MG tablet Take 1 tablet (50 mg total) by mouth daily. 30 tablet  2  . tiotropium (SPIRIVA) 18 MCG inhalation capsule Place into inhaler and inhale daily.    Marland Kitchen amLODipine (NORVASC) 10 MG tablet     . carbamazepine (TEGRETOL) 200 MG tablet Take 1 tablet (200 mg total) by mouth 2 (two) times daily. (Patient not taking: Reported on 04/24/2015) 60 tablet 2  . esomeprazole (NEXIUM) 40 MG capsule     . methylPREDNISolone (MEDROL DOSEPAK) 4 MG TBPK tablet     . oxyCODONE-acetaminophen (PERCOCET/ROXICET) 5-325 MG per tablet Take by mouth.    . predniSONE (DELTASONE) 10 MG tablet Take 1 tablet by mouth daily.    . traZODone (DESYREL) 100 MG tablet Take 100 mg by mouth.    . zolpidem (AMBIEN) 10 MG tablet      No current facility-administered medications for this visit.    Medical Decision Making:  Established Problem, Stable/Improving (1)  Treatment Plan Summary:Medication management patient is tolerating the sertraline well and appears to have had some benefits and regards his anxiety and sleep. This will continue him on his current regimen of sertraline, carbamazepine, Remeron and alprazolam. Patient will follow up in 3 months. They've encouraged call with any questions or concerns prior to next appointment.   Faith Rogue 04/24/2015, 10:43 AM

## 2015-07-24 ENCOUNTER — Ambulatory Visit (INDEPENDENT_AMBULATORY_CARE_PROVIDER_SITE_OTHER): Payer: 59 | Admitting: Psychiatry

## 2015-07-24 ENCOUNTER — Encounter: Payer: Self-pay | Admitting: Psychiatry

## 2015-07-24 VITALS — BP 162/70 | HR 88 | Temp 99.0°F | Ht 65.0 in | Wt 134.6 lb

## 2015-07-24 DIAGNOSIS — F0789 Other personality and behavioral disorders due to known physiological condition: Secondary | ICD-10-CM

## 2015-07-24 DIAGNOSIS — F09 Unspecified mental disorder due to known physiological condition: Secondary | ICD-10-CM | POA: Diagnosis not present

## 2015-07-24 MED ORDER — SERTRALINE HCL 50 MG PO TABS: 50.0000 mg | ORAL_TABLET | Freq: Every day | ORAL | Status: DC

## 2015-07-24 MED ORDER — MIRTAZAPINE 15 MG PO TABS: 15.0000 mg | ORAL_TABLET | Freq: Every day | ORAL | Status: DC

## 2015-07-24 MED ORDER — ALPRAZOLAM 0.5 MG PO TABS: 0.5000 mg | ORAL_TABLET | Freq: Three times a day (TID) | ORAL | Status: DC | PRN

## 2015-07-24 MED ORDER — CARBAMAZEPINE 200 MG PO TABS: 200.0000 mg | ORAL_TABLET | Freq: Two times a day (BID) | ORAL | Status: DC

## 2015-07-24 NOTE — Progress Notes (Signed)
[place] MD/PA/NP OP Progress Note  07/24/2015 11:13 AM Roger Moore  MRN:  [id number]  Subjective:  Patient returns for follow-up of his neurocognitive disorder and anxiety. Presents with his brother who is been at all previous appointments. They do not report any behavioral or emotional problems. Patient does state there is occasions where he does not sleep well. However his brother interjected that there is some sleeping going on during the day.  I did review with the patient any follow-up in his healthcare and regards to lab tests. They indicate that they did have blood work about 2 weeks ago with the patient's pulmonary physician.  Patient indicates that medication continues to work for him and denies any side effects or problems. I explained to both patient and brother that I will be departing the practice and they would have a another doctor when they come for the next follow-up.  Chief Complaint: anxiety Chief Complaint    Follow-up; Medication Refill     Visit Diagnosis:     ICD-9-CM ICD-10-CM   1. 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 CHF (congestive heart failure) ([place])   . Hypertension   . COPD (chronic obstructive pulmonary disease) [facility])     Past Surgical History  Procedure Laterality Date  . Kidney stone surgery  2 years ago  . Bladder surgery     Family History:  Family History  Problem Relation Age of Onset  . Hypertension Mother   . Hypertension Father   . Heart attack Father   . Diabetes Brother    Social History:  Social History   Social History  . Marital Status: Single    Spouse Name: N/A  . Number of Children: N/A  . Years of Education: N/A   Social History Main Topics  . Smoking status: Former Smoker    Quit date: 01/22/2008  . Smokeless tobacco: Former Systems developer    Quit date: 01/22/2008  . Alcohol Use: No  . Drug  Use: No  . Sexual Activity: Not Currently   Other Topics Concern  . None   Social History Narrative   Additional History: None  Assessment:   Musculoskeletal: Strength & Muscle Tone: Patient ambulates with a portable oxygen tank and slightly slow gait. But grossly normal motor strength. Gait & Station: Slow  Patient leans: N/A  Psychiatric Specialty Exam: Anxiety Patient reports no insomnia, nervous/anxious behavior or suicidal ideas.    Depression        Associated symptoms include does not have insomnia and no suicidal ideas.  Past medical history includes anxiety.     Review of Systems  Psychiatric/Behavioral: Positive for memory loss. Negative for depression, suicidal ideas, hallucinations and substance abuse. The patient is not nervous/anxious and does not have insomnia.   All other systems reviewed and are negative.   Blood pressure 162/70, pulse 88, temperature 99 F (37.2 C), temperature source Tympanic, height 5\' 5"  (1.651 m), weight 134 lb 9.6 oz (61.054 kg), SpO2 88 %.Body mass index is 22.4 kg/(m^2).  General Appearance: Fairly Groomed  Eye Contact:  Fair  Speech:  Response appropriately, brief concrete answers  Volume:  Normal  Mood:  Good  Affect:  Smiling  Thought Process:  Concrete  Orientation:  Full (Time, Place, and Person)  Thought Content:  Negative  Suicidal Thoughts:  No  Homicidal Thoughts:  No  Memory:  Immediate;   Good Recent;   Fair Remote;   Poor  Judgement:  Fair  Insight:  Fair  Psychomotor Activity:  Negative  Concentration:  Fair  Recall:  Poor  Fund of Knowledge: Poor  Language: Fair  Akathisia:  Negative  Handed:  Right  AIMS (if indicated):  Not done  Assets:  Social Support  ADL's:  Impaired  Cognition: Impaired,  Severe  Sleep:  Good as above   Is the patient at risk to self?  No. Has the patient been a risk to self in the past 6 months?  No. Has the patient been a risk to self within the distant past?  No. Is the  patient a risk to others?  No. Has the patient been a risk to others in the past 6 months?  No. Has the patient been a risk to others within the distant past?  No.  Current Medications: Current Outpatient Prescriptions  Medication Sig Dispense Refill  . albuterol (PROVENTIL) (2.5 MG/3ML) 0.083% nebulizer solution Inhale into the lungs as needed. 1 every six hours, as needed    . albuterol-ipratropium (COMBIVENT) 18-103 MCG/ACT inhaler Inhale into the lungs as needed.    . ALPRAZolam (XANAX) 0.5 MG tablet Take 1 tablet (0.5 mg total) by mouth 3 (three) times daily as needed. 1 am , 1 pm and 2 qhs tablets three times daily, as needed for 30 days 120 tablet 3  . amLODipine (NORVASC) 10 MG tablet     . amLODipine (NORVASC) 5 MG tablet Take 1 tablet by mouth daily.    Marland Kitchen aspirin EC 81 MG tablet Take 1 tablet by mouth daily.    Marland Kitchen azithromycin (ZITHROMAX) 250 MG tablet     . carbamazepine (TEGRETOL) 200 MG tablet Take 1 tablet (200 mg total) by mouth 2 (two) times daily. 60 tablet 3  . cetirizine (ZYRTEC) 10 MG tablet Take 1 tablet by mouth daily.    Marland Kitchen esomeprazole (NEXIUM) 40 MG capsule     . famotidine (PEPCID) 20 MG tablet Take 1 tablet by mouth 2 (two) times daily.    . finasteride (PROSCAR) 5 MG tablet Take 1 tablet by mouth daily.    . fluticasone (FLONASE) 50 MCG/ACT nasal spray Place into the nose.    Marland Kitchen Fluticasone-Salmeterol (ADVAIR) 250-50 MCG/DOSE AEPB Inhale into the lungs every 12 (twelve) hours.    . hydrochlorothiazide (HYDRODIURIL) 12.5 MG tablet Take 1 tablet by mouth daily.    . hydrochlorothiazide (MICROZIDE) 12.5 MG capsule     . hydrocortisone cream 1 % Apply topically 2 (two) times daily.    Marland Kitchen latanoprost (XALATAN) 0.005 % ophthalmic solution Apply to eye daily.    Marland Kitchen levETIRAcetam (KEPPRA) 500 MG tablet Take 1 tablet by mouth 2 (two) times daily.    Marland Kitchen levofloxacin (LEVAQUIN) 500 MG tablet     . loratadine (CLARITIN) 10 MG tablet Take by mouth.    . lovastatin (MEVACOR) 20 MG  tablet Take 1 tablet by mouth daily. With dinner    . methylPREDNISolone (MEDROL DOSEPAK) 4 MG TBPK tablet     . metoprolol tartrate (LOPRESSOR) 25 MG tablet Take 1 tablet by mouth 2 (two) times daily.    . mirtazapine (REMERON) 15 MG tablet Take 1 tablet (15 mg total) by mouth at bedtime. 30 tablet 3  . oxyCODONE-acetaminophen (PERCOCET/ROXICET) 5-325 MG per tablet Take by mouth.    . pantoprazole (PROTONIX) 40 MG tablet Take 1 tablet by mouth daily.    . predniSONE (DELTASONE)  10 MG tablet Take 1 tablet by mouth daily.    . sertraline (ZOLOFT) 50 MG tablet Take 1 tablet (50 mg total) by mouth daily. 30 tablet 3  . tiotropium (SPIRIVA) 18 MCG inhalation capsule Place into inhaler and inhale daily.    Marland Kitchen zolpidem (AMBIEN) 10 MG tablet      No current facility-administered medications for this visit.    Medical Decision Making:  Established Problem, Stable/Improving (1)  Treatment Plan Summary:Medication management   Neurocognitive Disorder patient has been stable on his current medication regimen. This will continue him on his current regimen of sertraline 2 mg daily, carbamazepine 200 mg twice daily, Remeron 2 mg at bedtime and alprazolam  0.5 mg (1 in the morning, 1 in the afternoon and 2 at bedtime).   Patient will follow up in 3 months. They are encouraged call with any questions or concerns prior to next appointment.   Faith Rogue 07/24/2015, 11:13 AM

## 2015-08-15 NOTE — Progress Notes (Signed)
Refill- reorder 

## 2015-10-22 ENCOUNTER — Ambulatory Visit: Payer: 59 | Admitting: Psychiatry

## 2015-11-08 ENCOUNTER — Encounter: Payer: Self-pay | Admitting: Psychiatry

## 2015-11-08 ENCOUNTER — Ambulatory Visit (INDEPENDENT_AMBULATORY_CARE_PROVIDER_SITE_OTHER): Payer: 59 | Admitting: Psychiatry

## 2015-11-08 VITALS — BP 122/74 | HR 81 | Temp 98.8°F | Ht 65.0 in

## 2015-11-08 DIAGNOSIS — F319 Bipolar disorder, unspecified: Secondary | ICD-10-CM | POA: Diagnosis not present

## 2015-11-08 DIAGNOSIS — R419 Unspecified symptoms and signs involving cognitive functions and awareness: Secondary | ICD-10-CM

## 2015-11-08 DIAGNOSIS — F99 Mental disorder, not otherwise specified: Secondary | ICD-10-CM

## 2015-11-08 NOTE — Progress Notes (Signed)
Patient ID: Roger Moore, male   DOB: [birth year], 70 y.o.   MRN: [id number] [facility] MD/PA/NP OP Progress Note  11/08/2015 11:27 AM Roger Moore  MRN:  [id number]  Subjective:  Patient returns for follow-up of his Bipolar disorder and neurocognitive disorder. Patient was previously seen by Dr. Jimmye Norman. This the first visit for this patient with this clinician. Per Dr. Jimmye Norman notes, patient has a neurocognitive disorder but his brother reports that patient has had a diagnosis of bipolar disorder. Presents with his brother who is been at all previous appointments. Patient is in a wheelchair today along with his oxygen cylinder. His brother reports that patient had 2 falls in the last few days. Unable to say why he fell. Patient is unable to answer any questions. He just mumbles and smiles. He is unable to say the date or if he has any symptoms . Patient is slightly tremulous in his wheelchair. His brother reports that he seems to doing okay otherwise.   Chief Complaint: anxiety Chief Complaint    Follow-up; Medication Refill     Visit Diagnosis:   Bipolar disorder  Past Medical History:  Past Medical History  Diagnosis Date  . CHF (congestive heart failure) ([place])   . Hypertension   . COPD (chronic obstructive pulmonary disease) [facility]     Past Surgical History  Procedure Laterality Date  . Kidney stone surgery  2 years ago  . Bladder surgery     Family History:  Family History  Problem Relation Age of Onset  . Hypertension Mother   . Hypertension Father   . Heart attack Father   . Diabetes Brother    Social History:  Social History   Social History  . Marital Status: Single    Spouse Name: N/A  . Number of Children: N/A  . Years of Education: N/A   Social History Main Topics  . Smoking status: Former Smoker    Quit date: 01/22/2008  . Smokeless tobacco: Former Systems developer    Quit date: 01/22/2008  . Alcohol Use: No  . Drug Use: No  . Sexual Activity: Not Currently   Other  Topics Concern  . None   Social History Narrative   Additional History: None  Assessment:   Musculoskeletal: Strength & Muscle Tone: Patient ambulates with a portable oxygen tank and slightly slow gait. But grossly normal motor strength. Gait & Station: Slow  Patient leans: N/A  Psychiatric Specialty Exam: Anxiety Patient reports no insomnia, nervous/anxious behavior or suicidal ideas.    Depression        Associated symptoms include does not have insomnia and no suicidal ideas.  Past medical history includes anxiety.     Review of Systems  Psychiatric/Behavioral: Positive for memory loss. Negative for depression, suicidal ideas, hallucinations and substance abuse. The patient is not nervous/anxious and does not have insomnia.   All other systems reviewed and are negative.   Blood pressure 122/74, pulse 81, temperature 98.8 F (37.1 C), temperature source Tympanic, height 5\' 5"  (1.651 m), SpO2 89 %.There is no weight on file to calculate BMI.  General Appearance: Fairly Groomed  Eye Contact:  Fair  Speech:  Response appropriately, brief concrete answers  Volume:  Normal  Mood:  Good  Affect:  Smiling  Thought Process:  Concrete  Orientation:  Full (Time, Place, and Person)  Thought Content:  Negative  Suicidal Thoughts:  No  Homicidal Thoughts:  No  Memory:  Immediate;   Good Recent;   Fair Remote;  Poor  Judgement:  Fair  Insight:  Fair  Psychomotor Activity:  Negative  Concentration:  Fair  Recall:  Poor  Fund of Knowledge: Poor  Language: Fair  Akathisia:  Negative  Handed:  Right  AIMS (if indicated):  Not done  Assets:  Social Support  ADL's:  Impaired  Cognition: Impaired,  Severe  Sleep:  Good as above   Is the patient at risk to self?  No. Has the patient been a risk to self in the past 6 months?  No. Has the patient been a risk to self within the distant past?  No. Is the patient a risk to others?  No. Has the patient been a risk to others in the  past 6 months?  No. Has the patient been a risk to others within the distant past?  No.  Current Medications: Current Outpatient Prescriptions  Medication Sig Dispense Refill  . albuterol (PROVENTIL) (2.5 MG/3ML) 0.083% nebulizer solution Inhale into the lungs as needed. 1 every six hours, as needed    . albuterol-ipratropium (COMBIVENT) 18-103 MCG/ACT inhaler Inhale into the lungs as needed.    . ALPRAZolam (XANAX) 0.5 MG tablet Take 1 tablet (0.5 mg total) by mouth 3 (three) times daily as needed. 1 am , 1 pm and 2 qhs tablets three times daily, as needed for 30 days 120 tablet 3  . amLODipine (NORVASC) 10 MG tablet     . amLODipine (NORVASC) 5 MG tablet Take 1 tablet by mouth daily.    Marland Kitchen aspirin EC 81 MG tablet Take 1 tablet by mouth daily.    Marland Kitchen azithromycin (ZITHROMAX) 250 MG tablet     . carbamazepine (TEGRETOL) 200 MG tablet Take 1 tablet (200 mg total) by mouth 2 (two) times daily. 60 tablet 3  . cetirizine (ZYRTEC) 10 MG tablet Take 1 tablet by mouth daily.    Marland Kitchen esomeprazole (NEXIUM) 40 MG capsule     . famotidine (PEPCID) 20 MG tablet Take 1 tablet by mouth 2 (two) times daily.    . finasteride (PROSCAR) 5 MG tablet Take 1 tablet by mouth daily.    . fluticasone (FLONASE) 50 MCG/ACT nasal spray Place into the nose.    Marland Kitchen Fluticasone-Salmeterol (ADVAIR) 250-50 MCG/DOSE AEPB Inhale into the lungs every 12 (twelve) hours.    . hydrochlorothiazide (HYDRODIURIL) 12.5 MG tablet Take 1 tablet by mouth daily.    . hydrochlorothiazide (MICROZIDE) 12.5 MG capsule     . hydrocortisone cream 1 % Apply topically 2 (two) times daily.    Marland Kitchen latanoprost (XALATAN) 0.005 % ophthalmic solution Apply to eye daily.    Marland Kitchen levETIRAcetam (KEPPRA) 500 MG tablet Take 1 tablet by mouth 2 (two) times daily.    Marland Kitchen levofloxacin (LEVAQUIN) 500 MG tablet     . loratadine (CLARITIN) 10 MG tablet Take by mouth.    . lovastatin (MEVACOR) 20 MG tablet Take 1 tablet by mouth daily. With dinner    . methylPREDNISolone  (MEDROL DOSEPAK) 4 MG TBPK tablet     . metoprolol tartrate (LOPRESSOR) 25 MG tablet Take 1 tablet by mouth 2 (two) times daily.    . mirtazapine (REMERON) 15 MG tablet Take 1 tablet (15 mg total) by mouth at bedtime. 30 tablet 3  . oxyCODONE-acetaminophen (PERCOCET/ROXICET) 5-325 MG per tablet Take by mouth.    . pantoprazole (PROTONIX) 40 MG tablet Take 1 tablet by mouth daily.    . predniSONE (DELTASONE) 10 MG tablet Take 1 tablet by mouth daily.    Marland Kitchen  sertraline (ZOLOFT) 50 MG tablet Take 1 tablet (50 mg total) by mouth daily. 30 tablet 3  . tiotropium (SPIRIVA) 18 MCG inhalation capsule Place into inhaler and inhale daily.    Marland Kitchen zolpidem (AMBIEN) 10 MG tablet      No current facility-administered medications for this visit.    Medical Decision Making:  Established Problem, Stable/Improving (1)  Treatment Plan Summary:Medication management   Bipolar disorder- continue carbamazepine at 200 mg twice daily Continue Remeron at 15 mg at bedtime Continue sertraline at 50 mg at bedtime Taper alprazolam to 0.5 mg twice daily for 2 weeks and then 0.5 mg once daily for 2 weeks and discontinue. Discussed with patient's brother about the risks for falls which have already been happening and the risk of increased sedation and memory impairment on the alprazolam. Strongly urged to obtain a neurology consult for patient. Will consider discontinuing the Remeron at next visit if patient continues to have increased sedation or falls.  Neurocognitive Disorder - discussed with patient's brother that they will need to follow up with a neurologist .  Patient will follow up in 3 months. They are encouraged call with any questions or concerns prior to next appointment.   Royal Vandevoort 11/08/2015, 11:27 AM

## 2015-11-12 ENCOUNTER — Encounter: Payer: Self-pay | Admitting: *Deleted

## 2015-11-12 ENCOUNTER — Emergency Department: Payer: Medicare Other

## 2015-11-12 ENCOUNTER — Observation Stay
Admission: EM | Admit: 2015-11-12 | Discharge: 2015-11-15 | Disposition: A | Discharge status: Skilled Nursing Facility | Payer: Medicare Other | Attending: Internal Medicine | Admitting: Internal Medicine

## 2015-11-12 DIAGNOSIS — R251 Tremor, unspecified: Secondary | ICD-10-CM | POA: Diagnosis not present

## 2015-11-12 DIAGNOSIS — I700 Atherosclerosis of aorta: Secondary | ICD-10-CM | POA: Diagnosis not present

## 2015-11-12 DIAGNOSIS — I10 Essential (primary) hypertension: Secondary | ICD-10-CM | POA: Insufficient documentation

## 2015-11-12 DIAGNOSIS — N390 Urinary tract infection, site not specified: Principal | ICD-10-CM | POA: Diagnosis present

## 2015-11-12 DIAGNOSIS — Z9889 Other specified postprocedural states: Secondary | ICD-10-CM | POA: Diagnosis not present

## 2015-11-12 DIAGNOSIS — I509 Heart failure, unspecified: Secondary | ICD-10-CM | POA: Diagnosis not present

## 2015-11-12 DIAGNOSIS — J9611 Chronic respiratory failure with hypoxia: Secondary | ICD-10-CM | POA: Insufficient documentation

## 2015-11-12 DIAGNOSIS — R569 Unspecified convulsions: Secondary | ICD-10-CM | POA: Insufficient documentation

## 2015-11-12 DIAGNOSIS — G9341 Metabolic encephalopathy: Secondary | ICD-10-CM | POA: Diagnosis not present

## 2015-11-12 DIAGNOSIS — Z8679 Personal history of other diseases of the circulatory system: Secondary | ICD-10-CM | POA: Diagnosis not present

## 2015-11-12 DIAGNOSIS — R918 Other nonspecific abnormal finding of lung field: Secondary | ICD-10-CM | POA: Insufficient documentation

## 2015-11-12 DIAGNOSIS — R4781 Slurred speech: Secondary | ICD-10-CM | POA: Diagnosis not present

## 2015-11-12 DIAGNOSIS — J449 Chronic obstructive pulmonary disease, unspecified: Secondary | ICD-10-CM | POA: Diagnosis not present

## 2015-11-12 DIAGNOSIS — Z87891 Personal history of nicotine dependence: Secondary | ICD-10-CM | POA: Diagnosis not present

## 2015-11-12 DIAGNOSIS — L989 Disorder of the skin and subcutaneous tissue, unspecified: Secondary | ICD-10-CM | POA: Insufficient documentation

## 2015-11-12 DIAGNOSIS — R748 Abnormal levels of other serum enzymes: Secondary | ICD-10-CM | POA: Diagnosis not present

## 2015-11-12 DIAGNOSIS — K219 Gastro-esophageal reflux disease without esophagitis: Secondary | ICD-10-CM | POA: Diagnosis not present

## 2015-11-12 DIAGNOSIS — Z7951 Long term (current) use of inhaled steroids: Secondary | ICD-10-CM | POA: Diagnosis not present

## 2015-11-12 DIAGNOSIS — Z7982 Long term (current) use of aspirin: Secondary | ICD-10-CM | POA: Insufficient documentation

## 2015-11-12 DIAGNOSIS — R4182 Altered mental status, unspecified: Secondary | ICD-10-CM | POA: Diagnosis not present

## 2015-11-12 DIAGNOSIS — R0602 Shortness of breath: Secondary | ICD-10-CM

## 2015-11-12 DIAGNOSIS — Z79899 Other long term (current) drug therapy: Secondary | ICD-10-CM | POA: Diagnosis not present

## 2015-11-12 LAB — COMPREHENSIVE METABOLIC PANEL
ALT: 34 U/L (ref 17–63)
AST: 38 U/L (ref 15–41)
Albumin: 3 g/dL — ABNORMAL LOW (ref 3.5–5.0)
Alkaline Phosphatase: 78 U/L (ref 38–126)
Anion gap: 7 (ref 5–15)
BILIRUBIN TOTAL: 0.4 mg/dL (ref 0.3–1.2)
BUN: 30 mg/dL — AB (ref 6–20)
CO2: 50 mmol/L — ABNORMAL HIGH (ref 22–32)
CREATININE: 0.95 mg/dL (ref 0.61–1.24)
Calcium: 9 mg/dL (ref 8.9–10.3)
Chloride: 91 mmol/L — ABNORMAL LOW (ref 101–111)
GFR calc non Af Amer: >60 mL/min (ref >60)
Glucose, Bld: 151 mg/dL — ABNORMAL HIGH (ref 65–99)
POTASSIUM: 3.5 mmol/L (ref 3.5–5.1)
Sodium: 148 mmol/L — ABNORMAL HIGH (ref 135–145)
TOTAL PROTEIN: 7 g/dL (ref 6.5–8.1)

## 2015-11-12 LAB — MAGNESIUM: MAGNESIUM: 1.7 mg/dL (ref 1.7–2.4)

## 2015-11-12 LAB — CARBAMAZEPINE LEVEL, TOTAL: CARBAMAZEPINE LVL: 4.6 ug/mL (ref 4.0–12.0)

## 2015-11-12 LAB — TROPONIN I: Troponin I: 0.04 ng/mL — ABNORMAL HIGH (ref <0.031)

## 2015-11-12 MED ORDER — IPRATROPIUM-ALBUTEROL 0.5-2.5 (3) MG/3ML IN SOLN
3.0000 mL | Freq: Once | RESPIRATORY_TRACT | Status: AC
  Administered 2015-11-12: 3 mL via RESPIRATORY_TRACT
  Filled 2015-11-12: qty 3

## 2015-11-12 NOTE — ED Notes (Signed)
Resumed care from sonja rn.  Dr brown at bedside with pt and family.  Pt alert.

## 2015-11-12 NOTE — ED Notes (Signed)
Patient is resting on stretcher. Family at  Bedside. Patient attempted to void, but was unable to at this time.

## 2015-11-12 NOTE — ED Notes (Signed)
Per EMS report, patient's brother called EMS due to patient having tremors and unsteady gait, confusion after waking from a nap. Patient can states his name, but is disoriented to place and date. Patient follows commands well, has equal grasp. Patient is on home O2 at 2L. Patient was 89% on 2L upon arrival.

## 2015-11-12 NOTE — ED Provider Notes (Signed)
[facility] Emergency Department Provider Note  ____________________________________________  Time seen: 11:00 PM  I have reviewed the triage vital signs and the nursing notes.   HISTORY  Chief Complaint Tremors      HPI HYUN FRIESE is a 70 y.o. male with history of CHF hypertension COPD bipolar disorder presents to the emergency department with history per family of bilateral upper extremity tremors with onset at 4 PM this afternoon lasting approximately "few minutes with complete resolution. Patient denies any complaints at this time. The patient's family states that he's had increasing unsteady gait and confusion after awakening from his nap today. Patient's family admits to increasing confusion.     Past Medical History  Diagnosis Date  . CHF (congestive heart failure) ([place])   . Hypertension   . COPD (chronic obstructive pulmonary disease) [facility])     Patient Active Problem List   Diagnosis Date Noted  . Cognitive disorder 11/22/2014  . Hypertension   . Bronchitis 01/25/2014  . Arteriosclerosis of coronary artery 01/25/2014  . Absolute anemia 01/25/2014  . Cardiomyopathy ([place]) 01/25/2014  . CCF (congestive cardiac failure) ([place]) 01/25/2014  . Acid reflux 01/25/2014  . BP (high blood pressure) 01/25/2014  . Peripheral blood vessel disorder ([place]) 01/25/2014  . Breath shortness 01/25/2014  . Peripheral vascular disease ([place]) 01/25/2014  . Congestive heart failure ([place]) 01/25/2014  . COPD, severe ([place]) 01/04/2014  . Hypoxia 01/04/2014  . Severe chronic obstructive pulmonary disease ([place]) 01/04/2014  . Renal colic [id number]  . Calculus of kidney 10/19/2012  . Calculi, ureter 10/19/2012  . 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 CA of skin 08/24/2012  . Hernia, inguinal 05/19/2012  . Benign prostatic hyperplasia with urinary obstruction 05/03/2012  . Chronic kidney disease (CKD), stage III (moderate) 05/03/2012  . Encysted hydrocele  05/03/2012  . Hydronephrosis 05/03/2012  . Hyperplasia of prostate with lower urinary tract symptoms (LUTS) 05/03/2012  . Incomplete bladder emptying 05/03/2012  . 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 Bladder retention 05/03/2012  . Infection of urinary tract 05/03/2012  . Emphysema, traumatic, subcutaneous ([place]) 02/20/2011  . Traumatic subcutaneous emphysema ([place]) 02/20/2011    Past Surgical History  Procedure Laterality Date  . Kidney stone surgery  2 years ago  . Bladder surgery      Current Outpatient Rx  Name  Route  Sig  Dispense  Refill  . albuterol (PROVENTIL) (2.5 MG/3ML) 0.083% nebulizer solution   Nebulization   Take 2.5 mg by nebulization every 6 (six) hours as needed for wheezing or shortness of breath.         . ALPRAZolam (XANAX) 0.25 MG tablet   Oral   Take 0.25 mg by mouth every 6 (six) hours as needed for anxiety.         Marland Kitchen amLODipine (NORVASC) 5 MG tablet   Oral   Take 1 tablet by mouth daily.         Marland Kitchen aspirin EC 81 MG tablet   Oral   Take 1 tablet by mouth daily.         . carbamazepine (TEGRETOL XR) 200 MG 12 hr tablet   Oral   Take 200 mg by mouth 2 (two) times daily.         . cetirizine (ZYRTEC) 10 MG tablet   Oral   Take 1 tablet by mouth daily.         . famotidine (PEPCID) 20 MG tablet   Oral  Take 1 tablet by mouth 2 (two) times daily.         . finasteride (PROSCAR) 5 MG tablet   Oral   Take 1 tablet by mouth daily.         . fluticasone (FLONASE) 50 MCG/ACT nasal spray   Nasal   Place 2 sprays into the nose daily.          . Fluticasone-Salmeterol (ADVAIR) 250-50 MCG/DOSE AEPB   Inhalation   Inhale into the lungs every 12 (twelve) hours.         . hydrochlorothiazide (MICROZIDE) 12.5 MG capsule   Oral   Take 12.5 mg by mouth daily.         . hydrocortisone cream 1 %   Topical   Apply topically 2 (two) times daily.         Marland Kitchen latanoprost (XALATAN) 0.005 % ophthalmic  solution   Ophthalmic   Apply 1 drop to eye daily.          Marland Kitchen levETIRAcetam (KEPPRA) 500 MG tablet   Oral   Take 1 tablet by mouth 2 (two) times daily.         Marland Kitchen loratadine (CLARITIN) 10 MG tablet   Oral   Take 10 mg by mouth daily.          Marland Kitchen lovastatin (MEVACOR) 20 MG tablet   Oral   Take 1 tablet by mouth daily. With dinner         . metoprolol tartrate (LOPRESSOR) 25 MG tablet   Oral   Take 1 tablet by mouth 2 (two) times daily.         . pantoprazole (PROTONIX) 40 MG tablet   Oral   Take 1 tablet by mouth daily.         Marland Kitchen tiotropium (SPIRIVA) 18 MCG inhalation capsule   Inhalation   Place into inhaler and inhale daily.         . traZODone (DESYREL) 100 MG tablet   Oral   Take 100 mg by mouth at bedtime.         Marland Kitchen zolpidem (AMBIEN) 5 MG tablet   Oral   Take 5 mg by mouth at bedtime as needed for sleep.           Allergies No known drug allergies  Family History  Problem Relation Age of Onset  . Hypertension Mother   . Hypertension Father   . Heart attack Father   . Diabetes Brother     Social History Social History  Substance Use Topics  . Smoking status: Former Smoker    Quit date: 01/22/2008  . Smokeless tobacco: Former Systems developer    Quit date: 01/22/2008  . Alcohol Use: No    Review of Systems  Constitutional: Positive for fever. Eyes: Negative for visual changes. ENT: Negative for sore throat. Cardiovascular: Negative for chest pain. Respiratory: Negative for shortness of breath. Gastrointestinal: Negative for abdominal pain, vomiting and diarrhea. Genitourinary: Negative for dysuria. Musculoskeletal: Negative for back pain. Skin: Negative for rash. Neurological: Negative for headaches, focal weakness or numbness.   10-point ROS otherwise negative.  ____________________________________________   PHYSICAL EXAM:  VITAL SIGNS: ED Triage Vitals  Enc Vitals Group     BP 11/12/15 2023 171/65 mmHg     Pulse Rate 11/12/15  2023 86     Resp 11/12/15 2023 20     Temp 11/12/15 2023 98.3 F (36.8 C)     Temp Source 11/12/15 2023 Oral  SpO2 11/12/15 2013 95 %     Weight 11/12/15 2023 126 lb 4.8 oz (57.289 kg)     Height --      Head Cir --      Peak Flow --      Pain Score --      Pain Loc --      Pain Edu? --      Excl. in [place]? --     Constitutional: Alert and oriented. Well appearing and in no distress. Eyes: Conjunctivae are normal. PERRL. Normal extraocular movements. ENT   Head: Normocephalic and atraumatic.   Nose: No congestion/rhinnorhea.   Mouth/Throat: Mucous membranes are moist.   Neck: No stridor. Hematological/Lymphatic/Immunilogical: No cervical lymphadenopathy. Cardiovascular: Normal rate, regular rhythm. Normal and symmetric distal pulses are present in all extremities. No murmurs, rubs, or gallops. Respiratory: Normal respiratory effort without tachypnea nor retractions. Breath sounds are clear and equal bilaterally. No wheezes/rales/rhonchi. Gastrointestinal: Soft and nontender. No distention. There is no CVA tenderness. Genitourinary: deferred Musculoskeletal: Nontender with normal range of motion in all extremities. No joint effusions.  No lower extremity tenderness nor edema. Neurologic:  Normal speech and language. No gross focal neurologic deficits are appreciated. Speech is normal.  Skin:  Skin is warm, dry and intact. No rash noted. Psychiatric: Mood and affect are normal. Speech and behavior are normal. Patient exhibits appropriate insight and judgment.  ____________________________________________    LABS (pertinent positives/negatives)  Labs Reviewed  COMPREHENSIVE METABOLIC PANEL - Abnormal; Notable for the following:    Sodium 148 (*)    Chloride 91 (*)    CO2 50 (*)    Glucose, Bld 151 (*)    BUN 30 (*)    Albumin 3.0 (*)    All other components within normal limits  URINALYSIS COMPLETEWITH MICROSCOPIC ([facility] ONLY) - Abnormal; Notable for the  following:    Color, Urine YELLOW (*)    APPearance CLOUDY (*)    Hgb urine dipstick 1+ (*)    Protein, ur 100 (*)    Leukocytes, UA 3+ (*)    Bacteria, UA RARE (*)    All other components within normal limits  TROPONIN I - Abnormal; Notable for the following:    Troponin I 0.04 (*)    All other components within normal limits  TROPONIN I - Abnormal; Notable for the following:    Troponin I 0.05 (*)    All other components within normal limits  CBC - Abnormal; Notable for the following:    RBC 3.48 (*)    Hemoglobin 10.6 (*)    HCT 32.8 (*)    All other components within normal limits  URINE CULTURE  CARBAMAZEPINE LEVEL, TOTAL  MAGNESIUM  TSH  HEMOGLOBIN A1C  TROPONIN I  TROPONIN I  TROPONIN I      RADIOLOGY   CT Head Wo Contrast (Final result) Result time: 11/12/15 21:21:37   Final result by Rad Results In Interface (11/12/15 21:21:37)   Narrative:   CLINICAL DATA: Confusion, slurred speech.  EXAM: CT HEAD WITHOUT CONTRAST  TECHNIQUE: Contiguous axial images were obtained from the base of the skull through the vertex without intravenous contrast.  COMPARISON: CT scan of April 07, 2013.  FINDINGS: Status post right frontal and temporal craniotomy. Status post aneurysm clipping anteriorly. Minimal diffuse cortical atrophy is noted. Mild chronic ischemic white matter disease is noted. No mass effect or midline shift is noted. Ventricular size is within normal limits. There is no evidence of mass lesion, hemorrhage or  acute infarction.  IMPRESSION: Postsurgical changes as described above. Minimal diffuse cortical atrophy. Mild chronic ischemic white matter disease. No acute intracranial abnormality seen.   Electronically Signed By: Marijo Conception, M.D. On: 11/12/2015 21:21          DG Chest 2 View (Final result) Result time: 11/12/15 21:01:45   Final result by Rad Results In Interface (11/12/15 21:01:45)   Narrative:   CLINICAL DATA:  Hypoxia.  EXAM: CHEST 2 VIEW  COMPARISON: November 10, 2014.  FINDINGS: The heart size and mediastinal contours are within normal limits. No pneumothorax is noted. Atherosclerosis of thoracic aorta is noted. Minimal bilateral pleural effusions are noted. Stable bibasilar scarring is noted. No acute pulmonary disease is noted. The visualized skeletal structures are unremarkable.  IMPRESSION: Minimal bilateral pleural effusions. Stable bibasilar scarring. No acute cardiopulmonary abnormality seen.   Electronically Signed By: Marijo Conception, M.D. On: 11/12/2015 21:01        INITIAL IMPRESSION / ASSESSMENT AND PLAN / ED COURSE  Pertinent labs & imaging results that were available during my care of the patient were reviewed by me and considered in my medical decision making (see chart for details).  Patient discussed with Dr. Marcille Blanco for hospital admission for further evaluation and management.  ____________________________________________   FINAL CLINICAL IMPRESSION(S) / ED DIAGNOSES  Final diagnoses:  None  Urinary tract infection Altered mental status Elevated troponin    Gregor Hams, MD 11/13/15 [phone]

## 2015-11-13 DIAGNOSIS — N390 Urinary tract infection, site not specified: Secondary | ICD-10-CM | POA: Diagnosis present

## 2015-11-13 LAB — TROPONIN I
Troponin I: 0.05 ng/mL — ABNORMAL HIGH (ref <0.031)
Troponin I: 0.06 ng/mL — ABNORMAL HIGH (ref <0.031)
Troponin I: 0.09 ng/mL — ABNORMAL HIGH (ref <0.031)
Troponin I: 0.06 ng/mL — ABNORMAL HIGH (ref <0.031)

## 2015-11-13 LAB — HEMOGLOBIN A1C: Hgb A1c MFr Bld: 5.8 % (ref 4.0–6.0)

## 2015-11-13 LAB — URINALYSIS COMPLETE WITH MICROSCOPIC (ARMC ONLY)
BILIRUBIN URINE: NEGATIVE
GLUCOSE, UA: NEGATIVE mg/dL
KETONES UR: NEGATIVE mg/dL
NITRITE: NEGATIVE
PH: 7 (ref 5.0–8.0)
PROTEIN: 100 mg/dL — AB
SQUAMOUS EPITHELIAL / LPF: NONE SEEN
Specific Gravity, Urine: 1.013 (ref 1.005–1.030)

## 2015-11-13 LAB — CBC
HCT: 32.8 % — ABNORMAL LOW (ref 40.0–52.0)
Hemoglobin: 10.6 g/dL — ABNORMAL LOW (ref 13.0–18.0)
MCH: 30.4 pg (ref 26.0–34.0)
MCHC: 32.2 g/dL (ref 32.0–36.0)
MCV: 94.2 fL (ref 80.0–100.0)
Platelets: 194 10*3/uL (ref 150–440)
RBC: 3.48 MIL/uL — ABNORMAL LOW (ref 4.40–5.90)
RDW: 13.4 % (ref 11.5–14.5)
WBC: 8.3 10*3/uL (ref 3.8–10.6)

## 2015-11-13 LAB — TSH: TSH: 0.821 u[IU]/mL (ref 0.350–4.500)

## 2015-11-13 MED ORDER — PRAVASTATIN SODIUM 20 MG PO TABS
20.0000 mg | ORAL_TABLET | Freq: Every day | ORAL | Status: DC
  Administered 2015-11-13 – 2015-11-14 (×2): 20 mg via ORAL
  Filled 2015-11-13 (×2): qty 1

## 2015-11-13 MED ORDER — AMLODIPINE BESYLATE 5 MG PO TABS
5.0000 mg | ORAL_TABLET | Freq: Every day | ORAL | Status: DC
  Administered 2015-11-13 – 2015-11-15 (×3): 5 mg via ORAL
  Filled 2015-11-13 (×3): qty 1

## 2015-11-13 MED ORDER — LEVETIRACETAM 500 MG PO TABS
500.0000 mg | ORAL_TABLET | Freq: Two times a day (BID) | ORAL | Status: DC
  Administered 2015-11-13 – 2015-11-15 (×5): 500 mg via ORAL
  Filled 2015-11-13 (×5): qty 1

## 2015-11-13 MED ORDER — CARBAMAZEPINE ER 200 MG PO TB12
200.0000 mg | ORAL_TABLET | Freq: Two times a day (BID) | ORAL | Status: DC
  Administered 2015-11-13 – 2015-11-15 (×5): 200 mg via ORAL
  Filled 2015-11-13 (×5): qty 1

## 2015-11-13 MED ORDER — ALPRAZOLAM 0.25 MG PO TABS
0.2500 mg | ORAL_TABLET | Freq: Four times a day (QID) | ORAL | Status: DC | PRN
  Administered 2015-11-13 – 2015-11-15 (×2): 0.25 mg via ORAL
  Filled 2015-11-13 (×2): qty 1

## 2015-11-13 MED ORDER — FINASTERIDE 5 MG PO TABS
5.0000 mg | ORAL_TABLET | Freq: Every day | ORAL | Status: DC
  Administered 2015-11-13 – 2015-11-15 (×3): 5 mg via ORAL
  Filled 2015-11-13 (×3): qty 1

## 2015-11-13 MED ORDER — HYDROCHLOROTHIAZIDE 12.5 MG PO CAPS
12.5000 mg | ORAL_CAPSULE | Freq: Every day | ORAL | Status: DC
  Administered 2015-11-13 – 2015-11-15 (×3): 12.5 mg via ORAL
  Filled 2015-11-13 (×3): qty 1

## 2015-11-13 MED ORDER — TRAZODONE HCL 100 MG PO TABS
100.0000 mg | ORAL_TABLET | Freq: Every day | ORAL | Status: DC
  Administered 2015-11-13: 100 mg via ORAL
  Filled 2015-11-13: qty 1

## 2015-11-13 MED ORDER — SODIUM CHLORIDE 0.9% FLUSH
3.0000 mL | Freq: Two times a day (BID) | INTRAVENOUS | Status: DC
  Administered 2015-11-13 – 2015-11-14 (×3): 3 mL via INTRAVENOUS

## 2015-11-13 MED ORDER — ASPIRIN EC 81 MG PO TBEC
81.0000 mg | DELAYED_RELEASE_TABLET | Freq: Every day | ORAL | Status: DC
  Administered 2015-11-13 – 2015-11-15 (×3): 81 mg via ORAL
  Filled 2015-11-13 (×3): qty 1

## 2015-11-13 MED ORDER — FAMOTIDINE 20 MG PO TABS
20.0000 mg | ORAL_TABLET | Freq: Two times a day (BID) | ORAL | Status: DC
  Administered 2015-11-13 – 2015-11-15 (×5): 20 mg via ORAL
  Filled 2015-11-13 (×5): qty 1

## 2015-11-13 MED ORDER — ONDANSETRON HCL 4 MG PO TABS: 4.0000 mg | ORAL_TABLET | Freq: Four times a day (QID) | ORAL | Status: DC | PRN

## 2015-11-13 MED ORDER — METOPROLOL TARTRATE 25 MG PO TABS
25.0000 mg | ORAL_TABLET | Freq: Two times a day (BID) | ORAL | Status: DC
Start: 2015-11-13 — End: 2015-11-15
  Administered 2015-11-13 – 2015-11-15 (×5): 25 mg via ORAL
  Filled 2015-11-13 (×5): qty 1

## 2015-11-13 MED ORDER — SODIUM CHLORIDE 0.9 % IV BOLUS (SEPSIS)
1000.0000 mL | Freq: Once | INTRAVENOUS | Status: AC
  Administered 2015-11-13: 1000 mL via INTRAVENOUS

## 2015-11-13 MED ORDER — TIOTROPIUM BROMIDE MONOHYDRATE 18 MCG IN CAPS
18.0000 ug | ORAL_CAPSULE | Freq: Every day | RESPIRATORY_TRACT | Status: DC
  Administered 2015-11-13 – 2015-11-15 (×3): 18 ug via RESPIRATORY_TRACT
  Filled 2015-11-13: qty 5

## 2015-11-13 MED ORDER — LORATADINE 10 MG PO TABS
10.0000 mg | ORAL_TABLET | Freq: Every day | ORAL | Status: DC
  Filled 2015-11-13: qty 1

## 2015-11-13 MED ORDER — HALOPERIDOL LACTATE 5 MG/ML IJ SOLN: 5.0000 mg | Freq: Once | INTRAMUSCULAR | Status: DC

## 2015-11-13 MED ORDER — ACETAMINOPHEN 325 MG PO TABS: 650.0000 mg | ORAL_TABLET | Freq: Four times a day (QID) | ORAL | Status: DC | PRN

## 2015-11-13 MED ORDER — HALOPERIDOL LACTATE 5 MG/ML IJ SOLN
2.0000 mg | Freq: Once | INTRAMUSCULAR | Status: DC | PRN
  Filled 2015-11-13: qty 1

## 2015-11-13 MED ORDER — HYDROCORTISONE 1 % EX CREA
TOPICAL_CREAM | Freq: Two times a day (BID) | CUTANEOUS | Status: DC
  Administered 2015-11-13 – 2015-11-15 (×5): via TOPICAL
  Filled 2015-11-13: qty 28

## 2015-11-13 MED ORDER — ACETAMINOPHEN 650 MG RE SUPP: 650.0000 mg | Freq: Four times a day (QID) | RECTAL | Status: DC | PRN

## 2015-11-13 MED ORDER — ENOXAPARIN SODIUM 40 MG/0.4ML ~~LOC~~ SOLN
40.0000 mg | SUBCUTANEOUS | Status: DC
  Administered 2015-11-13 – 2015-11-15 (×3): 40 mg via SUBCUTANEOUS
  Filled 2015-11-13 (×3): qty 0.4

## 2015-11-13 MED ORDER — LORATADINE 10 MG PO TABS
10.0000 mg | ORAL_TABLET | Freq: Every day | ORAL | Status: DC
  Administered 2015-11-13 – 2015-11-15 (×3): 10 mg via ORAL
  Filled 2015-11-13 (×2): qty 1

## 2015-11-13 MED ORDER — HALOPERIDOL LACTATE 5 MG/ML IJ SOLN
2.0000 mg | Freq: Once | INTRAMUSCULAR | Status: AC
  Administered 2015-11-13: 2 mg via INTRAVENOUS
  Filled 2015-11-13: qty 1

## 2015-11-13 MED ORDER — FLUTICASONE PROPIONATE 50 MCG/ACT NA SUSP
2.0000 | Freq: Every day | NASAL | Status: DC
  Administered 2015-11-13 – 2015-11-15 (×3): 2 via NASAL
  Filled 2015-11-13: qty 16

## 2015-11-13 MED ORDER — DOCUSATE SODIUM 100 MG PO CAPS
100.0000 mg | ORAL_CAPSULE | Freq: Two times a day (BID) | ORAL | Status: DC
  Administered 2015-11-13 – 2015-11-15 (×5): 100 mg via ORAL
  Filled 2015-11-13 (×5): qty 1

## 2015-11-13 MED ORDER — MOMETASONE FURO-FORMOTEROL FUM 200-5 MCG/ACT IN AERO
2.0000 | INHALATION_SPRAY | Freq: Two times a day (BID) | RESPIRATORY_TRACT | Status: DC
  Administered 2015-11-13 – 2015-11-15 (×5): 2 via RESPIRATORY_TRACT
  Filled 2015-11-13: qty 8.8

## 2015-11-13 MED ORDER — ZOLPIDEM TARTRATE 5 MG PO TABS: 5.0000 mg | ORAL_TABLET | Freq: Every evening | ORAL | Status: DC | PRN

## 2015-11-13 MED ORDER — MORPHINE SULFATE (PF) 2 MG/ML IV SOLN: 1.0000 mg | INTRAVENOUS | Status: DC | PRN

## 2015-11-13 MED ORDER — NICOTINE 21 MG/24HR TD PT24
21.0000 mg | MEDICATED_PATCH | Freq: Every day | TRANSDERMAL | Status: DC
  Administered 2015-11-13 – 2015-11-15 (×3): 21 mg via TRANSDERMAL
  Filled 2015-11-13 (×3): qty 1

## 2015-11-13 MED ORDER — LATANOPROST 0.005 % OP SOLN
1.0000 [drp] | Freq: Every day | OPHTHALMIC | Status: DC
  Administered 2015-11-13 – 2015-11-14 (×2): 1 [drp] via OPHTHALMIC
  Filled 2015-11-13: qty 2.5

## 2015-11-13 MED ORDER — DEXTROSE 5 % IV SOLN
1.0000 g | Freq: Every day | INTRAVENOUS | Status: DC
  Administered 2015-11-13 – 2015-11-15 (×3): 1 g via INTRAVENOUS
  Filled 2015-11-13 (×3): qty 10

## 2015-11-13 MED ORDER — PANTOPRAZOLE SODIUM 40 MG PO TBEC
40.0000 mg | DELAYED_RELEASE_TABLET | Freq: Every day | ORAL | Status: DC
  Administered 2015-11-13 – 2015-11-15 (×3): 40 mg via ORAL
  Filled 2015-11-13 (×3): qty 1

## 2015-11-13 MED ORDER — ALBUTEROL SULFATE (2.5 MG/3ML) 0.083% IN NEBU: 2.5000 mg | INHALATION_SOLUTION | Freq: Four times a day (QID) | RESPIRATORY_TRACT | Status: DC | PRN

## 2015-11-13 MED ORDER — ONDANSETRON HCL 4 MG/2ML IJ SOLN: 4.0000 mg | Freq: Four times a day (QID) | INTRAMUSCULAR | Status: DC | PRN

## 2015-11-13 MED ORDER — SODIUM CHLORIDE 0.9 % IV SOLN
INTRAVENOUS | Status: DC
  Administered 2015-11-13: 75 mL via INTRAVENOUS

## 2015-11-13 NOTE — ED Notes (Signed)
Pt has not voided yet  Family with pt.

## 2015-11-13 NOTE — Progress Notes (Signed)
Tele d/c per order

## 2015-11-13 NOTE — Care Management Note (Signed)
Case Management Note  Patient Details  Name: Roger Moore MRN: [id number] Date of Birth: 09-13-1945  Subjective/Objective:                  Met with patient and one of his brothers Roger Moore) to discuss discharge planning. Patient's daughter  Roger Moore [phone] is HCPOA. and makes health care decisions for patient. Patient has history of [facility] for home health. He is on chronic O2 at home believed to be with [facility] also- not confirmed. Patient is from home with his daughter. He is currently under Medicare OBS which will not cover SNF.  Action/Plan: List of home health agencies left at bedside. Medicare OBS letter left for patient daughter. RNCM will continue to follow.     Expected Discharge Date:                  Expected Discharge Plan:     In-House Referral:     Discharge planning Services  CM Consult  Post Acute Care Choice:  Home Health Choice offered to:  Patient, Sibling  DME Arranged:    DME Agency:     HH Arranged:    [place] Agency:     Status of Service:  In process, will continue to follow  Medicare Important Message Given:    Date Medicare IM Given:    Medicare IM give by:    Date Additional Medicare IM Given:    Additional Medicare Important Message give by:     If discussed at [place] of Stay Meetings, dates discussed:    Additional Comments:  Indigo Chaddock, RN 11/13/2015, 10:00 AM

## 2015-11-13 NOTE — Evaluation (Signed)
Physical Therapy Evaluation Patient Details Name: Roger Moore MRN: [id number] DOB: [birth year] Today's Date: 11/13/2015   History of Present Illness  Pt is a 70 y.o. male presenting with tremors/seizure like behavior and admitted with elevated troponin and UTI.  PMH includes chronic home O2, s/p crani (R frontal and temporal), s/p aneurysm clipping, CHF, htn, COPD)  Clinical Impression  Pt unable to report his name, date of birth, place, time, or situation.  Per chart, pt lives at home with his daughter.  Pt unable to verbalize PLOF or home set-up.  Currently pt requires 1-2 assist with transfers for safety d/t significant impulsiveness and confusion (pt with difficulty following commands).  Pt would benefit from skilled PT to address noted impairments and functional limitations.  Anticipate pt would do better discharging to his own home (familiar setting) if he has 24/7 assist available.     Follow Up Recommendations Supervision/Assistance - 24 hour;Home health PT    Equipment Recommendations   (TBD)    Recommendations for Other Services       Precautions / Restrictions Precautions Precautions: Fall Restrictions Weight Bearing Restrictions: No      Mobility  Bed Mobility Overal bed mobility: Needs Assistance Bed Mobility: Supine to Sit;Sit to Supine     Supine to sit: Min guard Sit to supine: Min assist;+2 for physical assistance   General bed mobility comments: Pt impulsively sitting up on edge of bed requiring CGA for safety; 2 assist for safety to instruct pt to lay back down in bed end of session.  Transfers Overall transfer level: Needs assistance Equipment used: None Transfers: Sit to/from [facility] Sit to Stand: Min assist;Mod assist (from bed and commode) Stand pivot transfers: Min assist;+2 physical assistance (bed to/from commode)       General transfer comment: pt impulsively trying to get to bedside commode and pt assisted onto commode  with 1 assist but pt very unsafe and requiring max cueing and assist for safety; pt required 2 assist to stand to clean-up after bowel movement (3rd person for clean-up) but pt noted to be on his forefoot/toes B; pt requiring significant cueing to safely transfer back to bed.  Pt grabbing for therapist during transfers and did not use RW appropriately so discontinued walker use for safety during session.  Ambulation/Gait Ambulation/Gait assistance: Min assist;+2 physical assistance Ambulation Distance (Feet):  (3 feet x2 (bed to/from bedside commode)) Assistive device: 2 person hand held assist   Gait velocity: impulsive   General Gait Details: pt standing on his forefoot/toes in standing with forward lean; unsteady; decreased B step length/foot clearance  Stairs            Wheelchair Mobility    Modified Rankin (Stroke Patients Only)       Balance Overall balance assessment: Needs assistance Sitting-balance support: Bilateral upper extremity supported;Feet supported Sitting balance-Leahy Scale: Fair     Standing balance support: Bilateral upper extremity supported (therapist assist) Standing balance-Leahy Scale: Poor                               Pertinent Vitals/Pain Pain Assessment: Faces Faces Pain Scale: No hurt  Vitals stable and WFL throughout treatment session.    Home Living Family/patient expects to be discharged to:: Private residence Living Arrangements: Other relatives (Per notes pt is home with daughter)  Prior Function           Comments: Pt unable to verbalize PLOF or living situation when asked.     Hand Dominance        Extremity/Trunk Assessment   Upper Extremity Assessment: Difficult to assess due to impaired cognition           Lower Extremity Assessment: Difficult to assess due to impaired cognition         Communication   Communication: No difficulties  Cognition Arousal/Alertness:  Awake/alert Behavior During Therapy: Impulsive Overall Cognitive Status: No family/caregiver present to determine baseline cognitive functioning (Pt not oriented to person, place, time, or situation.)                      General Comments   Pt agreeable to PT session.  Paged and spoke directly with MD Hower regarding pt's elevated troponin lab values and MD Hower reported no cardiac concerns and cleared PT to see pt for physical therapy.     Exercises        Assessment/Plan    PT Assessment Patient needs continued PT services  PT Diagnosis Difficulty walking   PT Problem List Decreased balance;Decreased mobility;Decreased safety awareness  PT Treatment Interventions DME instruction;Gait training;Stair training;Functional mobility training;Therapeutic activities;Therapeutic exercise;Balance training;Patient/family education   PT Goals (Current goals can be found in the Care Plan section) Acute Rehab PT Goals Patient Stated Goal: pt did not state any goal other than to get to toilet for BM PT Goal Formulation: Patient unable to participate in goal setting    Frequency Min 2X/week   Barriers to discharge   Possibly level of support    Co-evaluation               End of Session Equipment Utilized During Treatment: Gait belt;Oxygen Activity Tolerance: Patient tolerated treatment well Patient left: in bed;with call bell/phone within reach;with bed alarm set (pt in low bed (at lowest position) with mats on both sides of bed; nursing set bed alarm prior to leaving room) Nurse Communication: Mobility status;Precautions    Functional Assessment Tool Used: AM-PAC without stairs Functional Limitation: Mobility: Walking and moving around Mobility: Walking and Moving Around Current Status [id number]): At least 40 percent but less than 60 percent impaired, limited or restricted Mobility: Walking and Moving Around Goal Status [phone]): At least 20 percent but less than 40 percent  impaired, limited or restricted    Time: [id number] PT Time Calculation (min) (ACUTE ONLY): 30 min   Charges:   PT Evaluation $PT Eval Moderate Complexity: 1 Procedure PT Treatments $Therapeutic Activity: 8-22 mins   PT G Codes:   PT G-Codes **NOT FOR INPATIENT CLASS** Functional Assessment Tool Used: AM-PAC without stairs Functional Limitation: Mobility: Walking and moving around Mobility: Walking and Moving Around Current Status [id number]): At least 40 percent but less than 60 percent impaired, limited or restricted Mobility: Walking and Moving Around Goal Status [phone]): At least 20 percent but less than 40 percent impaired, limited or restricted    Leitha Bleak 11/13/2015, 8:33 PM Leitha Bleak, [place]

## 2015-11-13 NOTE — Care Management Obs Status (Addendum)
[place] NOTIFICATION   Patient Details  Name: EVERT GRAVELLE MRN: [id number] Date of Birth: [birth year]   Medicare Observation Status Notification Given:  Yes  Long,Tisa Daughter [phone]     Malone Lavery, RN 11/13/2015, 8:42 AM

## 2015-11-13 NOTE — Clinical Social Work Note (Signed)
Clinical Social Work Assessment  Patient Details  Name: Roger Moore MRN: [id number] Date of Birth: 1945-11-09  Date of referral:  11/13/15               Reason for consult:  Facility Placement                Permission sought to share information with:  Chartered certified accountant granted to share information::  Yes, Verbal Permission Granted  Name::      Roger Moore::   [place]   Relationship::     Contact Information:     Housing/Transportation Living arrangements for the past 2 months:  Single Family Home Source of Information:  Power of [place], Adult Children Patient Interpreter Needed:  None Criminal Activity/Legal Involvement Pertinent to Current Situation/Hospitalization:  No - Comment as needed Significant Relationships:  Adult Children, Siblings Lives with:  Siblings Do you feel safe going back to the place where you live?    Need for family participation in patient care:  Yes (Comment)  Care giving concerns:  Patient lives in [place] with his brother Roger Moore.    Social Worker assessment / plan: [place] representative (CSW) received verbal consult from PT that the recommendation is home health with 24/7 supervision. Per PT patient is very impulsive and cannot be left alone. Per chart patient is not alert and oriented and no family is at bedside. CSW contacted patient's daughter Roger Moore to complete assessment. Per daughter patient lives with his brother Roger Moore. Per daughter patient has 3 brothers and is only left alone for 1 hour per day. CSW explained that PT is recommended 24/7 care. CSW discussed Moore term care options at SNF and ALF. Per daughter patient does not have to go to anywhere for Moore term care because he does not want to give up his money to get on Medicaid. Daughter did request for patient to go to [name] for [place]. Daughter reported that patient went to [name] in 2012 and  recovered very well. CSW explained that a bed search can be started.  FL2 complete and faxed out. CSW contacted [facility] admissions coordinator at [name] and asked her to review referral. RN Case Manager is aware of above. CSW will continue to follow and assist as needed.     Employment status:  Disabled (Comment on whether or not currently receiving Disability), Retired Nurse, adult PT Recommendations:  [place], Home with [place] / Referral to community resources:  [place]  Patient/Family's Response to care: Patient's daughter prefers for patient to go to [name] for [facility].    Patient/Family's Understanding of and Emotional Response to Diagnosis, Current Treatment, and Prognosis: Patient's daughter was pleasant and thanked CSW for calling.   Emotional Assessment Appearance:  Appears stated age Attitude/Demeanor/Rapport:  Unable to Assess Affect (typically observed):  Unable to Assess Orientation:  Oriented to Self, Fluctuating Orientation (Suspected and/or reported [place]) Alcohol / Substance use:  Not Applicable Psych involvement (Current and /or in the community):  No (Comment)  Discharge Needs  Concerns to be addressed:  Discharge Planning Concerns Readmission within the last 30 days:  No Current discharge risk:  Chronically ill, Dependent with Mobility Barriers to Discharge:  Continued Medical Work up   Loralyn Freshwater, LCSW 11/13/2015, 3:24 PM

## 2015-11-13 NOTE — NC FL2 (Signed)
[place] LEVEL OF CARE SCREENING TOOL     IDENTIFICATION  Patient Name: Roger Moore Birthdate: 1946-03-02 Sex: male Admission Date (Current Location): 11/12/2015  [place] and [place] Number:  Engineering geologist and Address:  [facility], [address] [place], [place] [address]      Provider Number: [id number]  Attending Physician Name and Address:  Lytle Butte, MD  Relative Name and Phone Number:       Current Level of Care: Hospital Recommended Level of Care: [place] Prior Approval Number:    Date Approved/Denied:   PASRR Number:    Discharge Plan: SNF    Current Diagnoses: Patient Active Problem List   Diagnosis Date Noted  . UTI (lower urinary tract infection) 11/13/2015  . Cognitive disorder 11/22/2014  . Hypertension   . Bronchitis 01/25/2014  . Arteriosclerosis of coronary artery 01/25/2014  . Absolute anemia 01/25/2014  . Cardiomyopathy ([place]) 01/25/2014  . CCF (congestive cardiac failure) ([place]) 01/25/2014  . Acid reflux 01/25/2014  . BP (high blood pressure) 01/25/2014  . Peripheral blood vessel disorder ([place]) 01/25/2014  . Breath shortness 01/25/2014  . Peripheral vascular disease ([place]) 01/25/2014  . Congestive heart failure ([place]) 01/25/2014  . COPD, severe ([place]) 01/04/2014  . Hypoxia 01/04/2014  . Severe chronic obstructive pulmonary disease ([place]) 01/04/2014  . Renal colic [id number]  . Calculus of kidney 10/19/2012  . Calculi, ureter 10/19/2012  . 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 CA of skin 08/24/2012  . Hernia, inguinal 05/19/2012  . Benign prostatic hyperplasia with urinary obstruction 05/03/2012  . Chronic kidney disease (CKD), stage III (moderate) 05/03/2012  . Encysted hydrocele 05/03/2012  . Hydronephrosis 05/03/2012  . Hyperplasia of prostate with lower urinary tract symptoms (LUTS) 05/03/2012  . Incomplete bladder emptying 05/03/2012  . Bladder neoplasm of uncertain  malignant potential 05/03/2012  . Bladder retention 05/03/2012  . Infection of urinary tract 05/03/2012  . Emphysema, traumatic, subcutaneous ([place]) 02/20/2011  . Traumatic subcutaneous emphysema ([place]) 02/20/2011    Orientation RESPIRATION BLADDER Height & Weight     Self  O2 (3-4 Liters Oxygen ) Continent Weight: 131 lb 14.4 oz (59.829 kg) Height:     BEHAVIORAL SYMPTOMS/MOOD NEUROLOGICAL BOWEL NUTRITION STATUS   (none )  (none ) Continent Diet (Regular Diet )  AMBULATORY STATUS COMMUNICATION OF NEEDS Skin   Extensive Assist Verbally Normal                       Personal Care Assistance Level of Assistance  Bathing, Feeding, Dressing Bathing Assistance: Limited assistance Feeding assistance: Independent Dressing Assistance: Limited assistance     Functional Limitations Info  Sight, Hearing, Speech Sight Info: Adequate Hearing Info: Adequate Speech Info: Adequate    SPECIAL CARE FACTORS FREQUENCY  PT (By licensed PT), OT (By licensed OT)     PT Frequency:  (5) OT Frequency:  (5)            Contractures      Additional Factors Info  Code Status, Allergies Code Status Info:  (Full Code. ) Allergies Info:  (No Known Allergies. )           Current Medications (11/13/2015):  This is the current hospital active medication list Current Facility-Administered Medications  Medication Dose Route Frequency Provider Last Rate Last Dose  . 0.9 %  sodium chloride infusion   Intravenous Continuous Harrie Foreman, MD 75 mL/hr at 11/13/15 0443 75 mL  at 11/13/15 0443  . acetaminophen (TYLENOL) tablet 650 mg  650 mg Oral Q6H PRN Harrie Foreman, MD       Or  . acetaminophen (TYLENOL) suppository 650 mg  650 mg Rectal Q6H PRN Harrie Foreman, MD      . albuterol (PROVENTIL) (2.5 MG/3ML) 0.083% nebulizer solution 2.5 mg  2.5 mg Nebulization Q6H PRN Harrie Foreman, MD      . ALPRAZolam Duanne Moron) tablet 0.25 mg  0.25 mg Oral Q6H PRN Harrie Foreman, MD      .  amLODipine (NORVASC) tablet 5 mg  5 mg Oral Daily Harrie Foreman, MD   5 mg at 11/13/15 0947  . aspirin EC tablet 81 mg  81 mg Oral Daily Harrie Foreman, MD   81 mg at 11/13/15 0947  . carbamazepine (TEGRETOL XR) 12 hr tablet 200 mg  200 mg Oral BID Harrie Foreman, MD   200 mg at 11/13/15 0947  . cefTRIAXone (ROCEPHIN) 1 g in dextrose 5 % 50 mL IVPB  1 g Intravenous Daily Lytle Butte, MD   1 g at 11/13/15 0945  . docusate sodium (COLACE) capsule 100 mg  100 mg Oral BID Harrie Foreman, MD   100 mg at 11/13/15 0947  . enoxaparin (LOVENOX) injection 40 mg  40 mg Subcutaneous Q24H Harrie Foreman, MD   40 mg at 11/13/15 0443  . famotidine (PEPCID) tablet 20 mg  20 mg Oral BID Harrie Foreman, MD   20 mg at 11/13/15 0946  . finasteride (PROSCAR) tablet 5 mg  5 mg Oral Daily Harrie Foreman, MD   5 mg at 11/13/15 0947  . fluticasone (FLONASE) 50 MCG/ACT nasal spray 2 spray  2 spray Each Nare Daily Harrie Foreman, MD   2 spray at 11/13/15 [phone]  . haloperidol lactate (HALDOL) injection 2 mg  2 mg Intravenous Once Lytle Butte, MD      . hydrochlorothiazide (MICROZIDE) capsule 12.5 mg  12.5 mg Oral Daily Harrie Foreman, MD   12.5 mg at 11/13/15 1000  . hydrocortisone cream 1 %   Topical BID Harrie Foreman, MD      . latanoprost (XALATAN) 0.005 % ophthalmic solution 1 drop  1 drop Both Eyes QHS Harrie Foreman, MD      . levETIRAcetam (KEPPRA) tablet 500 mg  500 mg Oral BID Harrie Foreman, MD   500 mg at 11/13/15 0959  . loratadine (CLARITIN) tablet 10 mg  10 mg Oral Daily Harrie Foreman, MD   10 mg at 11/13/15 0959  . metoprolol tartrate (LOPRESSOR) tablet 25 mg  25 mg Oral BID Harrie Foreman, MD   25 mg at 11/13/15 1000  . mometasone-formoterol (DULERA) 200-5 MCG/ACT inhaler 2 puff  2 puff Inhalation BID Harrie Foreman, MD   2 puff at 11/13/15 [phone]  . morphine 2 MG/ML injection 1 mg  1 mg Intravenous Q3H PRN Harrie Foreman, MD      . nicotine (NICODERM CQ -  dosed in mg/24 hours) patch 21 mg  21 mg Transdermal Daily Lytle Butte, MD      . ondansetron [facility]) tablet 4 mg  4 mg Oral Q6H PRN Harrie Foreman, MD       Or  . ondansetron [facility]) injection 4 mg  4 mg Intravenous Q6H PRN Harrie Foreman, MD      . pantoprazole (PROTONIX) EC tablet 40 mg  40 mg Oral Daily Harrie Foreman, MD   40 mg at 11/13/15 1000  . pravastatin (PRAVACHOL) tablet 20 mg  20 mg Oral q1800 Harrie Foreman, MD      . sodium chloride flush (NS) 0.9 % injection 3 mL  3 mL Intravenous Q12H Harrie Foreman, MD   3 mL at 11/13/15 0951  . tiotropium (SPIRIVA) inhalation capsule 18 mcg  18 mcg Inhalation Daily Harrie Foreman, MD   18 mcg at 11/13/15 [phone]  . traZODone (DESYREL) tablet 100 mg  100 mg Oral QHS Harrie Foreman, MD      . zolpidem [facility]) tablet 5 mg  5 mg Oral QHS PRN Harrie Foreman, MD         Discharge Medications: Please see discharge summary for a list of discharge medications.  Relevant Imaging Results:  Relevant Lab Results:   Additional Information  (SSN: [id number])  Loralyn Freshwater, LCSW

## 2015-11-13 NOTE — ED Notes (Signed)
In and cath done for ua.  Pt tolerated well.

## 2015-11-13 NOTE — Progress Notes (Signed)
Lab called about patient Troponin of 0.06. Dr called, no new orders given, will continue to monitor.

## 2015-11-13 NOTE — Clinical Social Work Placement (Signed)
   CLINICAL SOCIAL WORK PLACEMENT  NOTE  Date:  11/13/2015  Patient Details  Name: Roger Moore MRN: [id number] Date of Birth: 11/13/1945  Clinical Social Work is seeking post-discharge placement for this patient at the [place] level of care (*CSW will initial, date and re-position this form in  chart as items are completed):  Yes   Patient/family provided with [place] Work Department's list of facilities offering this level of care within the geographic area requested by the patient (or if unable, by the patient's family).  Yes   Patient/family informed of their freedom to choose among providers that offer the needed level of care, that participate in Medicare, Medicaid or managed care program needed by the patient, have an available bed and are willing to accept the patient.  Yes   Patient/family informed of [facility]'s ownership interest in [facility] and [facility], as well as of the fact that they are under no obligation to receive care at these facilities.  PASRR submitted to EDS on 11/13/15 (Patient had an expiried PASARR )     PASRR number received on       Existing PASRR number confirmed on       FL2 transmitted to all facilities in geographic area requested by pt/family on 11/13/15     FL2 transmitted to all facilities within larger geographic area on       Patient informed that his/her managed care company has contracts with or will negotiate with certain facilities, including the following:            Patient/family informed of bed offers received.  Patient chooses bed at       Physician recommends and patient chooses bed at      Patient to be transferred to   on  .  Patient to be transferred to facility by       Patient family notified on   of transfer.  Name of family member notified:        PHYSICIAN       Additional Comment:    _______________________________________________ Loralyn Freshwater,  LCSW 11/13/2015, 3:23 PM

## 2015-11-13 NOTE — Progress Notes (Signed)
[facility] [place] NAME: Roger Moore    MR#:  [id number]  DATE OF BIRTH:  10-25-1945  SUBJECTIVE:  States feels fine, however is confused, brother at bedside  REVIEW OF SYSTEMS:  CONSTITUTIONAL: No fever, Positive fatigue or weakness.  EYES: No blurred or double vision.  EARS, NOSE, AND THROAT: No tinnitus or ear pain.  RESPIRATORY: No cough, shortness of breath, wheezing or hemoptysis.  CARDIOVASCULAR: No chest pain, orthopnea, edema.  GASTROINTESTINAL: No nausea, vomiting, diarrhea or abdominal pain.  GENITOURINARY: No dysuria, hematuria.  ENDOCRINE: No polyuria, nocturia,  HEMATOLOGY: No anemia, easy bruising or bleeding SKIN: No rash or lesion. MUSCULOSKELETAL: No joint pain or arthritis.   NEUROLOGIC: No tingling, numbness, weakness.  PSYCHIATRY: No anxiety or depression.   DRUG ALLERGIES:  No Known Allergies  VITALS:  Blood pressure 150/64, pulse 75, temperature 98.3 F (36.8 C), temperature source Axillary, resp. rate 18, weight 59.829 kg (131 lb 14.4 oz), SpO2 98 %.  PHYSICAL EXAMINATION:  VITAL SIGNS: Filed Vitals:   11/13/15 0746 11/13/15 1134  BP: 162/78 150/64  Pulse: 92 75  Temp: 99.7 F (37.6 C) 98.3 F (36.8 C)  Resp: 18 18   GENERAL:69 y.o.male currently in no acute distress. Disheveled HEAD: Normocephalic, atraumatic.  EYES: Pupils equal, round, reactive to light. Extraocular muscles intact. No scleral icterus.  MOUTH: Moist mucosal membrane. Dentition intact. No abscess noted.  EAR, NOSE, THROAT: Clear without exudates. No external lesions.  NECK: Supple. No thyromegaly. No nodules. No JVD.  PULMONARY: Clear to ascultation, without wheeze rails or rhonci. No use of accessory muscles, Good respiratory effort. good air entry bilaterally CHEST: Nontender to palpation.  CARDIOVASCULAR: S1 and S2. Regular rate and rhythm. No murmurs, rubs, or gallops. No edema. Pedal pulses 2+ bilaterally.   GASTROINTESTINAL: Soft, nontender, nondistended. No masses. Positive bowel sounds. No hepatosplenomegaly.  MUSCULOSKELETAL: No swelling, clubbing, or edema. Range of motion full in all extremities.  NEUROLOGIC: Cranial nerves II through XII are intact. No gross focal neurological deficits. Sensation intact. Reflexes intact.  SKIN: No ulceration, lesions, rashes, or cyanosis. Skin warm and dry. Turgor intact.  PSYCHIATRIC: Mood, affect within normal limits. The patient is awake, alert and oriented x person place. Insight, judgment intact.      LABORATORY PANEL:   CBC  Recent Labs Lab 11/13/15 0544  WBC 8.3  HGB 10.6*  HCT 32.8*  PLT 194   ------------------------------------------------------------------------------------------------------------------  Chemistries   Recent Labs Lab 11/12/15 2124  NA 148*  K 3.5  CL 91*  CO2 50*  GLUCOSE 151*  BUN 30*  CREATININE 0.95  CALCIUM 9.0  MG 1.7  AST 38  ALT 34  ALKPHOS 78  BILITOT 0.4   ------------------------------------------------------------------------------------------------------------------  Cardiac Enzymes  Recent Labs Lab 11/13/15 1339  TROPONINI 0.09*   ------------------------------------------------------------------------------------------------------------------  RADIOLOGY:  Dg Chest 2 View  11/12/2015  CLINICAL DATA:  Hypoxia. EXAM: CHEST  2 VIEW COMPARISON:  November 10, 2014. FINDINGS: The heart size and mediastinal contours are within normal limits. No pneumothorax is noted. Atherosclerosis of thoracic aorta is noted. Minimal bilateral pleural effusions are noted. Stable bibasilar scarring is noted. No acute pulmonary disease is noted. The visualized skeletal structures are unremarkable. IMPRESSION: Minimal bilateral pleural effusions. Stable bibasilar scarring. No acute cardiopulmonary abnormality seen. Electronically Signed   By: Marijo Conception, M.D.   On: 11/12/2015 21:01   Ct Head Wo  Contrast  11/12/2015  CLINICAL DATA:  Confusion, slurred speech. EXAM:  CT HEAD WITHOUT CONTRAST TECHNIQUE: Contiguous axial images were obtained from the base of the skull through the vertex without intravenous contrast. COMPARISON:  CT scan of April 07, 2013. FINDINGS: Status post right frontal and temporal craniotomy. Status post aneurysm clipping anteriorly. Minimal diffuse cortical atrophy is noted. Mild chronic ischemic white matter disease is noted. No mass effect or midline shift is noted. Ventricular size is within normal limits. There is no evidence of mass lesion, hemorrhage or acute infarction. IMPRESSION: Postsurgical changes as described above. Minimal diffuse cortical atrophy. Mild chronic ischemic white matter disease. No acute intracranial abnormality seen. Electronically Signed   By: Marijo Conception, M.D.   On: 11/12/2015 21:21    EKG:   Orders placed or performed during the hospital encounter of 11/12/15  . ED EKG  . ED EKG    ASSESSMENT AND PLAN:   70 year old Caucasian gentleman with history of COPD chronic respiratory failure 2-3 L nasal cannula Baseline admitted 11/12/2015 altered mental status weakness  1. Urinary tract infection continue ceftriaxone day #2/5, follow culture data in 2. Generalized weakness: Likely secondary to above, consult physical therapy 3. Essential hypertension: Lopressor 4. COPD not in acute exacerbation due to liver, brain and treatments, Spiriva 5. GERD without esophagitis PPI therapy 6. Venous thromboembolism prophylactic: Lovenox     All the records are reviewed and case discussed with Care Management/Social Workerr. Management plans discussed with the patient, family and they are in agreement.  CODE STATUS: Full  TOTAL TIME TAKING CARE OF THIS PATIENT: 28 minutes.   POSSIBLE D/C IN 1-2 DAYS, DEPENDING ON CLINICAL CONDITION.   Zeah Germano,  Karenann Cai.D on 11/13/2015 at 3:04 PM  Between 7am to 6pm - Pager - [phone]  After 6pm:  House Pager: - [place] [facility]  Office  [phone]  CC: Primary care physician; Marguerita Merles, MD

## 2015-11-13 NOTE — Progress Notes (Signed)
Pts family at the bedside, Pt resting. IV  Haldol not needed at this time.

## 2015-11-13 NOTE — H&P (Signed)
Roger Moore is an 70 y.o. male.   Chief Complaint: Tremors HPI: The patient presented to the emergency department due to seizure-like behavior.  His friend states that he appeared "out of it" and tensed briefly.  Immediately following the episode, which lasted a few seconds, the patient was weak but conscious and able to speak.  No loss of continence.  In the emergency department, the patient was found to have a slightly elevated troponin.  He never complained of chest pain or shortness of breath.  The result was retaken and elevated ever so slightly more which prompted the emergency department staff to call for admission.  Past Medical History  Diagnosis Date  . CHF (congestive heart failure) ([place])   . Hypertension   . COPD (chronic obstructive pulmonary disease) [facility])     Past Surgical History  Procedure Laterality Date  . Kidney stone surgery  2 years ago  . Bladder surgery      Family History  Problem Relation Age of Onset  . Hypertension Mother   . Hypertension Father   . Heart attack Father   . Diabetes Brother    Social History:  reports that he quit smoking about 7 years ago. He quit smokeless tobacco use about 7 years ago. He reports that he does not drink alcohol or use illicit drugs.  Allergies: No Known Allergies  Medications Prior to Admission  Medication Sig Dispense Refill  . albuterol (PROVENTIL) (2.5 MG/3ML) 0.083% nebulizer solution Take 2.5 mg by nebulization every 6 (six) hours as needed for wheezing or shortness of breath.    . ALPRAZolam (XANAX) 0.25 MG tablet Take 0.25 mg by mouth every 6 (six) hours as needed for anxiety.    Marland Kitchen amLODipine (NORVASC) 5 MG tablet Take 1 tablet by mouth daily.    Marland Kitchen aspirin EC 81 MG tablet Take 1 tablet by mouth daily.    . carbamazepine (TEGRETOL XR) 200 MG 12 hr tablet Take 200 mg by mouth 2 (two) times daily.    . cetirizine (ZYRTEC) 10 MG tablet Take 1 tablet by mouth daily.    . famotidine (PEPCID) 20 MG tablet Take 1  tablet by mouth 2 (two) times daily.    . finasteride (PROSCAR) 5 MG tablet Take 1 tablet by mouth daily.    . fluticasone (FLONASE) 50 MCG/ACT nasal spray Place 2 sprays into the nose daily.     . Fluticasone-Salmeterol (ADVAIR) 250-50 MCG/DOSE AEPB Inhale into the lungs every 12 (twelve) hours.    . hydrochlorothiazide (MICROZIDE) 12.5 MG capsule Take 12.5 mg by mouth daily.    . hydrocortisone cream 1 % Apply topically 2 (two) times daily.    Marland Kitchen latanoprost (XALATAN) 0.005 % ophthalmic solution Apply 1 drop to eye daily.     Marland Kitchen levETIRAcetam (KEPPRA) 500 MG tablet Take 1 tablet by mouth 2 (two) times daily.    Marland Kitchen loratadine (CLARITIN) 10 MG tablet Take 10 mg by mouth daily.     Marland Kitchen lovastatin (MEVACOR) 20 MG tablet Take 1 tablet by mouth daily. With dinner    . metoprolol tartrate (LOPRESSOR) 25 MG tablet Take 1 tablet by mouth 2 (two) times daily.    . pantoprazole (PROTONIX) 40 MG tablet Take 1 tablet by mouth daily.    Marland Kitchen tiotropium (SPIRIVA) 18 MCG inhalation capsule Place into inhaler and inhale daily.    . traZODone (DESYREL) 100 MG tablet Take 100 mg by mouth at bedtime.    Marland Kitchen zolpidem (AMBIEN) 5 MG tablet  Take 5 mg by mouth at bedtime as needed for sleep.      Results for orders placed or performed during the hospital encounter of 11/12/15 (from the past 48 hour(s))  Comprehensive metabolic panel     Status: Abnormal   Collection Time: 11/12/15  9:24 PM  Result Value Ref Range   Sodium 148 (H) 135 - 145 mmol/L   Potassium 3.5 3.5 - 5.1 mmol/L   Chloride 91 (L) 101 - 111 mmol/L   CO2 50 (H) 22 - 32 mmol/L   Glucose, Bld 151 (H) 65 - 99 mg/dL   BUN 30 (H) 6 - 20 mg/dL   Creatinine, Ser 0.95 0.61 - 1.24 mg/dL   Calcium 9.0 8.9 - 10.3 mg/dL   Total Protein 7.0 6.5 - 8.1 g/dL   Albumin 3.0 (L) 3.5 - 5.0 g/dL   AST 38 15 - 41 U/L   ALT 34 17 - 63 U/L   Alkaline Phosphatase 78 38 - 126 U/L   Total Bilirubin 0.4 0.3 - 1.2 mg/dL   GFR calc non Af Amer >60 >60 mL/min   GFR calc Af Amer  >60 >60 mL/min    Comment: (NOTE) The eGFR has been calculated using the CKD EPI equation. This calculation has not been validated in all clinical situations. eGFR's persistently <60 mL/min signify possible Chronic Kidney Disease.    Anion gap 7 5 - 15  Carbamazepine level, total     Status: None   Collection Time: 11/12/15  9:24 PM  Result Value Ref Range   Carbamazepine Lvl 4.6 4.0 - 12.0 ug/mL  Magnesium     Status: None   Collection Time: 11/12/15  9:24 PM  Result Value Ref Range   Magnesium 1.7 1.7 - 2.4 mg/dL  Troponin I     Status: Abnormal   Collection Time: 11/12/15  9:24 PM  Result Value Ref Range   Troponin I 0.04 (H) <0.031 ng/mL    Comment: READ BACK AND VERIFIED WITH SONDRA WEAVER AT 2202 11/12/15 [facility]        PERSISTENTLY INCREASED TROPONIN VALUES IN THE RANGE OF 0.04-0.49 ng/mL CAN BE SEEN IN:       -UNSTABLE ANGINA       -CONGESTIVE HEART FAILURE       -MYOCARDITIS       -CHEST TRAUMA       -ARRYHTHMIAS       -LATE PRESENTING MYOCARDIAL INFARCTION       -COPD   CLINICAL FOLLOW-UP RECOMMENDED.   Troponin I     Status: Abnormal   Collection Time: 11/13/15 12:27 AM  Result Value Ref Range   Troponin I 0.05 (H) <0.031 ng/mL    Comment: PREVIOUS RESULT CALLED BY [facility] @ 2202 ON 11/12/2015 CAF        PERSISTENTLY INCREASED TROPONIN VALUES IN THE RANGE OF 0.04-0.49 ng/mL CAN BE SEEN IN:       -UNSTABLE ANGINA       -CONGESTIVE HEART FAILURE       -MYOCARDITIS       -CHEST TRAUMA       -ARRYHTHMIAS       -LATE PRESENTING MYOCARDIAL INFARCTION       -COPD   CLINICAL FOLLOW-UP RECOMMENDED.   Urinalysis complete, with microscopic ([facility] only)     Status: Abnormal   Collection Time: 11/13/15  1:15 AM  Result Value Ref Range   Color, Urine YELLOW (A) YELLOW   APPearance CLOUDY (A) CLEAR   Glucose, UA  NEGATIVE NEGATIVE mg/dL   Bilirubin Urine NEGATIVE NEGATIVE   Ketones, ur NEGATIVE NEGATIVE mg/dL   Specific Gravity, Urine 1.013 1.005 - 1.030   Hgb urine  dipstick 1+ (A) NEGATIVE   pH 7.0 5.0 - 8.0   Protein, ur 100 (A) NEGATIVE mg/dL   Nitrite NEGATIVE NEGATIVE   Leukocytes, UA 3+ (A) NEGATIVE   RBC / HPF TOO NUMEROUS TO COUNT 0 - 5 RBC/hpf   WBC, UA TOO NUMEROUS TO COUNT 0 - 5 WBC/hpf   Bacteria, UA RARE (A) NONE SEEN   Squamous Epithelial / LPF NONE SEEN NONE SEEN   Dg Chest 2 View  11/12/2015  CLINICAL DATA:  Hypoxia. EXAM: CHEST  2 VIEW COMPARISON:  November 10, 2014. FINDINGS: The heart size and mediastinal contours are within normal limits. No pneumothorax is noted. Atherosclerosis of thoracic aorta is noted. Minimal bilateral pleural effusions are noted. Stable bibasilar scarring is noted. No acute pulmonary disease is noted. The visualized skeletal structures are unremarkable. IMPRESSION: Minimal bilateral pleural effusions. Stable bibasilar scarring. No acute cardiopulmonary abnormality seen. Electronically Signed   By: Marijo Conception, M.D.   On: 11/12/2015 21:01   Ct Head Wo Contrast  11/12/2015  CLINICAL DATA:  Confusion, slurred speech. EXAM: CT HEAD WITHOUT CONTRAST TECHNIQUE: Contiguous axial images were obtained from the base of the skull through the vertex without intravenous contrast. COMPARISON:  CT scan of April 07, 2013. FINDINGS: Status post right frontal and temporal craniotomy. Status post aneurysm clipping anteriorly. Minimal diffuse cortical atrophy is noted. Mild chronic ischemic white matter disease is noted. No mass effect or midline shift is noted. Ventricular size is within normal limits. There is no evidence of mass lesion, hemorrhage or acute infarction. IMPRESSION: Postsurgical changes as described above. Minimal diffuse cortical atrophy. Mild chronic ischemic white matter disease. No acute intracranial abnormality seen. Electronically Signed   By: Marijo Conception, M.D.   On: 11/12/2015 21:21    Review of Systems  Constitutional: Negative for fever and chills.  HENT: Negative for sore throat and tinnitus.   Eyes:  Negative for blurred vision and redness.  Respiratory: Negative for cough and shortness of breath.   Cardiovascular: Negative for chest pain, palpitations, orthopnea and PND.  Gastrointestinal: Negative for nausea, vomiting, abdominal pain and diarrhea.  Genitourinary: Negative for dysuria, urgency and frequency.  Musculoskeletal: Negative for myalgias and joint pain.  Skin: Negative for rash.       No lesions  Neurological: Negative for speech change, focal weakness and weakness.  Endo/Heme/Allergies: Does not bruise/bleed easily.       No temperature intolerance  Psychiatric/Behavioral: Negative for depression and suicidal ideas. The patient is nervous/anxious.     Blood pressure 180/70, pulse 104, temperature 100 F (37.8 C), temperature source Axillary, resp. rate 18, weight 59.829 kg (131 lb 14.4 oz), SpO2 92 %. Physical Exam  Nursing note and vitals reviewed. Constitutional: He is oriented to person, place, and time. He appears well-developed and well-nourished. No distress.  HENT:  Head: Normocephalic and atraumatic.  Mouth/Throat: Oropharynx is clear and moist.  Eyes: Conjunctivae and EOM are normal. Pupils are equal, round, and reactive to light. No scleral icterus.  Neck: Normal range of motion. Neck supple. No JVD present. No tracheal deviation present. No thyromegaly present.  Cardiovascular: Normal rate, regular rhythm and normal heart sounds.   Respiratory: Effort normal and breath sounds normal. No respiratory distress.  GI: Soft. Bowel sounds are normal. He exhibits no distension. There is no  tenderness.  Genitourinary:  Deferred  Musculoskeletal: Normal range of motion. He exhibits no edema.  Lymphadenopathy:    He has no cervical adenopathy.  Neurological: He is alert and oriented to person, place, and time.  Skin: Skin is warm and dry. No rash noted. No erythema.     Psychiatric: He has a normal mood and affect. His behavior is normal. Judgment and thought  content normal.     Assessment/Plan This is a 70 year old male admitted for elevated troponin and UTI. 1. UTI: no signs or symptoms of sepsis.  Ceftriaxone for now.  Transition to oral medication for 10-day course for discharge. 2. Elevated troponin: marginal; likely secondary to episode of tremulousness which may be seizure activity.  Trend enzymes. Cardiology consult at discretion of primary team. 3. Essential hypertension: continue home meds 4. COPD: Continue ICS and Spiriva 5. Skin lesions: concern for malignancy.  Consider dermatology consult 6. DVT prophylaxis: Lovenox 7. GI prophylaxis: PPI per home regimen The patient is a full code.  Time spent on admission orders and patient care approximately 35 minutes  Harrie Foreman, MD 11/13/2015, 5:36 AM

## 2015-11-13 NOTE — Progress Notes (Signed)
Nurse requesting Nicotine patch, Pt also out of bed w/o assistance. MD Hower paged. MD placing orders for nicotine patch, and Haldol X1.

## 2015-11-13 NOTE — ED Notes (Addendum)
md in with pt and family.   

## 2015-11-13 NOTE — Progress Notes (Signed)
Dr called about patient fluids and his history of CHF. New orders given to decrease IV fluids, will continue monitor.

## 2015-11-13 NOTE — ED Notes (Signed)
Pt drinking water   Family with pt.  Pt unable to void   md aware

## 2015-11-14 ENCOUNTER — Observation Stay: Payer: Medicare Other

## 2015-11-14 LAB — BASIC METABOLIC PANEL
ANION GAP: 5 (ref 5–15)
BUN: 23 mg/dL — ABNORMAL HIGH (ref 6–20)
CALCIUM: 8.1 mg/dL — AB (ref 8.9–10.3)
CO2: 45 mmol/L — AB (ref 22–32)
Chloride: 91 mmol/L — ABNORMAL LOW (ref 101–111)
Creatinine, Ser: 0.91 mg/dL (ref 0.61–1.24)
GLUCOSE: 99 mg/dL (ref 65–99)
POTASSIUM: 3.1 mmol/L — AB (ref 3.5–5.1)
Sodium: 141 mmol/L (ref 135–145)

## 2015-11-14 LAB — URINE CULTURE: Special Requests: NORMAL

## 2015-11-14 NOTE — Progress Notes (Signed)
[facility] [place] NAME: Roger Moore    MR#:  [id number]  DATE OF BIRTH:  Oct 29, 1945  SUBJECTIVE:  States feels fine, however is confused, brother at bedside  REVIEW OF SYSTEMS:  CONSTITUTIONAL: No fever, Positive fatigue or weakness.  EYES: No blurred or double vision.  EARS, NOSE, AND THROAT: No tinnitus or ear pain.  RESPIRATORY: No cough, shortness of breath, wheezing or hemoptysis.  CARDIOVASCULAR: No chest pain, orthopnea, edema.  GASTROINTESTINAL: No nausea, vomiting, diarrhea or abdominal pain.  GENITOURINARY: No dysuria, hematuria.  ENDOCRINE: No polyuria, nocturia,  HEMATOLOGY: No anemia, easy bruising or bleeding SKIN: No rash or lesion. MUSCULOSKELETAL: No joint pain or arthritis.   NEUROLOGIC: No tingling, numbness, weakness.  PSYCHIATRY: No anxiety or depression.   DRUG ALLERGIES:  No Known Allergies  VITALS:  Blood pressure 129/74, pulse 117, temperature 98 F (36.7 C), temperature source Axillary, resp. rate 23, weight 70.761 kg (156 lb), SpO2 85 %.  PHYSICAL EXAMINATION:  VITAL SIGNS: Filed Vitals:   11/14/15 0849 11/14/15 1120  BP: 129/74   Pulse: 84 117  Temp: 98 F (36.7 C)   Resp: 23    GENERAL:69 y.o.male currently in no acute distress. Disheveled HEAD: Normocephalic, atraumatic.  EYES: Pupils equal, round, reactive to light. Extraocular muscles intact. No scleral icterus.  MOUTH: Moist mucosal membrane. Dentition intact. No abscess noted.  EAR, NOSE, THROAT: Clear without exudates. No external lesions.  NECK: Supple. No thyromegaly. No nodules. No JVD.  PULMONARY: Clear to ascultation, without wheeze rails or rhonci. No use of accessory muscles, Good respiratory effort. good air entry bilaterally CHEST: Nontender to palpation.  CARDIOVASCULAR: S1 and S2. Regular rate and rhythm. No murmurs, rubs, or gallops. No edema. Pedal pulses 2+ bilaterally.  GASTROINTESTINAL: Soft, nontender,  nondistended. No masses. Positive bowel sounds. No hepatosplenomegaly.  MUSCULOSKELETAL: No swelling, clubbing, or edema. Range of motion full in all extremities.  NEUROLOGIC: Cranial nerves II through XII are intact. No gross focal neurological deficits. Sensation intact. Reflexes intact.  SKIN: No ulceration, lesions, rashes, or cyanosis. Skin warm and dry. Turgor intact.  PSYCHIATRIC: Mood, affect within normal limits. The patient is awake, alert and oriented x person place. Insight, judgment intact.      LABORATORY PANEL:   CBC  Recent Labs Lab 11/13/15 0544  WBC 8.3  HGB 10.6*  HCT 32.8*  PLT 194   ------------------------------------------------------------------------------------------------------------------  Chemistries   Recent Labs Lab 11/12/15 2124 11/14/15 0547  NA 148* 141  K 3.5 3.1*  CL 91* 91*  CO2 50* 45*  GLUCOSE 151* 99  BUN 30* 23*  CREATININE 0.95 0.91  CALCIUM 9.0 8.1*  MG 1.7  --   AST 38  --   ALT 34  --   ALKPHOS 78  --   BILITOT 0.4  --    ------------------------------------------------------------------------------------------------------------------  Cardiac Enzymes  Recent Labs Lab 11/13/15 2203  TROPONINI 0.06*   ------------------------------------------------------------------------------------------------------------------  RADIOLOGY:  Dg Chest 2 View  11/12/2015  CLINICAL DATA:  Hypoxia. EXAM: CHEST  2 VIEW COMPARISON:  November 10, 2014. FINDINGS: The heart size and mediastinal contours are within normal limits. No pneumothorax is noted. Atherosclerosis of thoracic aorta is noted. Minimal bilateral pleural effusions are noted. Stable bibasilar scarring is noted. No acute pulmonary disease is noted. The visualized skeletal structures are unremarkable. IMPRESSION: Minimal bilateral pleural effusions. Stable bibasilar scarring. No acute cardiopulmonary abnormality seen. Electronically Signed   By: Marijo Conception, M.D.  On:  11/12/2015 21:01   Ct Head Wo Contrast  11/12/2015  CLINICAL DATA:  Confusion, slurred speech. EXAM: CT HEAD WITHOUT CONTRAST TECHNIQUE: Contiguous axial images were obtained from the base of the skull through the vertex without intravenous contrast. COMPARISON:  CT scan of April 07, 2013. FINDINGS: Status post right frontal and temporal craniotomy. Status post aneurysm clipping anteriorly. Minimal diffuse cortical atrophy is noted. Mild chronic ischemic white matter disease is noted. No mass effect or midline shift is noted. Ventricular size is within normal limits. There is no evidence of mass lesion, hemorrhage or acute infarction. IMPRESSION: Postsurgical changes as described above. Minimal diffuse cortical atrophy. Mild chronic ischemic white matter disease. No acute intracranial abnormality seen. Electronically Signed   By: Marijo Conception, M.D.   On: 11/12/2015 21:21    EKG:   Orders placed or performed during the hospital encounter of 11/12/15  . ED EKG  . ED EKG    ASSESSMENT AND PLAN:   70 year old Caucasian gentleman with history of COPD chronic respiratory failure 2-3 L nasal cannula Baseline admitted 11/12/2015 altered mental status weakness  1. Urinary tract infection continue ceftriaxone day #3/5, follow culture data in 2. Generalized weakness: Likely secondary to above, consult physical therapy 3. Essential hypertension: Lopressor 4. COPD not in acute exacerbation due to liver, brain and treatments, Spiriva 5. GERD without esophagitis PPI therapy 6. Venous thromboembolism prophylactic: Lovenox     All the records are reviewed and case discussed with Care Management/Social Workerr. Management plans discussed with the patient, family and they are in agreement.  CODE STATUS: Full  TOTAL TIME TAKING CARE OF THIS PATIENT: 28 minutes.   POSSIBLE D/C IN 1-2 DAYS, DEPENDING ON CLINICAL CONDITION.   Hower,  Karenann Cai.D on 11/14/2015 at 2:36 PM  Between 7am to 6pm - Pager  - [phone]  After 6pm: House Pager: - [facility]  Office  [phone]  CC: Primary care physician; Marguerita Merles, MD

## 2015-11-14 NOTE — Progress Notes (Signed)
Sitter was discharged today. PASARR was received. Patient's daughter Roger Moore called [facility] representative (CSW) back. Daughter is agreeable for patient to go to [facility]. CSW will continue to follow and assist as needed.   [name], LCSW [phone]

## 2015-11-14 NOTE — Progress Notes (Signed)
Physical Therapy Treatment Patient Details Name: Roger Moore MRN: [id number] DOB: Feb 21, 1946 Today's Date: 11/14/2015    History of Present Illness Pt is a 70 y.o. male presenting with tremors/seizure like behavior and admitted with elevated troponin and UTI.  PMH includes chronic home O2, s/p crani (R frontal and temporal), s/p aneurysm clipping, CHF, htn, COPD)    PT Comments    Pt able to progress to ambulating 60 feet with RW min assist x1 and chair follow; distance limited d/t O2 desaturation from 95% at rest to 85-86% with ambulation on 4 L/min O2 via nasal cannula (nursing notified immediately and pt required a few minutes rest with vc's for breathing technique to increase back to 92%).  Pt requiring consistent vc's for safety and for activity during session.  Will progress pt with balance and ambulation distance per pt tolerance next session.   Follow Up Recommendations  Supervision/Assistance - 24 hour;Home health PT     Equipment Recommendations  Rolling walker with 5" wheels    Recommendations for Other Services       Precautions / Restrictions Precautions Precautions: Fall Restrictions Weight Bearing Restrictions: No    Mobility  Bed Mobility Overal bed mobility: Needs Assistance Bed Mobility: Supine to Sit     Supine to sit: Min assist     General bed mobility comments: Pt requiring vc's to sit up on edge of bed.  Transfers Overall transfer level: Needs assistance Equipment used: Rolling walker (2 wheeled)   Sit to Stand: Min assist         General transfer comment: pt required vc's for hand placement and safety with transfer  Ambulation/Gait Ambulation/Gait assistance: Min assist;+2 safety/equipment (with chair follow and assist for supplemental O2) Ambulation Distance (Feet): 60 Feet Assistive device: Rolling walker (2 wheeled)   Gait velocity: decreased   General Gait Details: decreased B step length/foot clearance/heelstrike; impulsive;  vc's for safety required   Stairs            Wheelchair Mobility    Modified Rankin (Stroke Patients Only)       Balance Overall balance assessment: Needs assistance Sitting-balance support: Bilateral upper extremity supported;Feet supported Sitting balance-Leahy Scale: Good     Standing balance support: Bilateral upper extremity supported (on RW) Standing balance-Leahy Scale: Fair  Pt stood with min assist x1 and 2nd assist to change underwear d/t incontinence.                    Cognition Arousal/Alertness: Awake/alert Behavior During Therapy: Impulsive Overall Cognitive Status:  (Oriented to person.)                      Exercises      General Comments General comments (skin integrity, edema, etc.): bruising noted R lower leg  Sitter present during entire session.      Pertinent Vitals/Pain Pain Assessment: No/denies pain  HR 94-117 bpm during session.    Home Living                      Prior Function            PT Goals (current goals can now be found in the care plan section) Acute Rehab PT Goals Patient Stated Goal: to improve independence with mobility PT Goal Formulation: With family Progress towards PT goals: Progressing toward goals    Frequency  Min 2X/week    PT Plan Current plan remains appropriate    Co-evaluation  End of Session Equipment Utilized During Treatment: Gait belt;Oxygen Activity Tolerance: Patient limited by fatigue (Limited d/t O2 desaturation) Patient left: in chair;with call bell/phone within reach;with nursing/sitter in room     Time: [id number] PT Time Calculation (min) (ACUTE ONLY): 25 min  Charges:  $Gait Training: 8-22 mins $Therapeutic Activity: 8-22 mins                    G CodesLeitha Bleak 2015-11-17, 11:49 AM Leitha Bleak, [place]

## 2015-11-14 NOTE — Clinical Social Work Placement (Signed)
   CLINICAL SOCIAL WORK PLACEMENT  NOTE  Date:  11/14/2015  Patient Details  Name: [name] MRN: [id number] Date of Birth: Jun 11, 1946  Clinical Social Work is seeking post-discharge placement for this patient at the [place] level of care (*CSW will initial, date and re-position this form in  chart as items are completed):  Yes   Patient/family provided with [facility] Work Department's list of facilities offering this level of care within the geographic area requested by the patient (or if unable, by the patient's family).  Yes   Patient/family informed of their freedom to choose among providers that offer the needed level of care, that participate in Medicare, Medicaid or managed care program needed by the patient, have an available bed and are willing to accept the patient.  Yes   Patient/family informed of [facility]'s ownership interest in [facility] and [facility], as well as of the fact that they are under no obligation to receive care at these facilities.  PASRR submitted to EDS on 11/13/15 (Patient had an expiried PASARR )     [facility] number received on 11/14/15 ([id number] )     Existing PASRR number confirmed on       FL2 transmitted to all facilities in geographic area requested by pt/family on 11/13/15     FL2 transmitted to all facilities within larger geographic area on       Patient informed that his/her managed care company has contracts with or will negotiate with certain facilities, including the following:        Yes   Patient/family informed of bed offers received.  Patient chooses bed at  [facility] )     Physician recommends and patient chooses bed at      Patient to be transferred to   on  .  Patient to be transferred to facility by       Patient family notified on   of transfer.  Name of family member notified:        PHYSICIAN       Additional Comment:     _______________________________________________ Loralyn [place], LCSW 11/14/2015, 2:46 PM

## 2015-11-14 NOTE — Progress Notes (Addendum)
Clinical Education officer, museum (CSW) contacted patient's daughter Roger Moore to let her know [facility] has extended a bed offer. Daughter did not answer and a voicemail was left. CSW met with patient and his brother Roger Moore has was bedside. Patient was asleep and did not answer questions. CSW made patient aware that his daughter wants him to go [name] from [facility]. Patient's brother Roger Moore reported that there are 5 brothers that provide support. PASARR is pending. CSW will continue to follow and assist as needed.   CSW faxed requested clinicals to [place] today.   [name], LCSW [phone]

## 2015-11-15 MED ORDER — CEPHALEXIN 500 MG PO CAPS: 500.0000 mg | ORAL_CAPSULE | Freq: Two times a day (BID) | ORAL | Status: AC

## 2015-11-15 MED ORDER — ALPRAZOLAM 0.25 MG PO TABS: 0.2500 mg | ORAL_TABLET | Freq: Four times a day (QID) | ORAL | Status: AC | PRN

## 2015-11-15 MED ORDER — CETYLPYRIDINIUM CHLORIDE 0.05 % MT LIQD
7.0000 mL | Freq: Two times a day (BID) | OROMUCOSAL | Status: DC
  Administered 2015-11-15: 7 mL via OROMUCOSAL

## 2015-11-15 NOTE — Progress Notes (Signed)
Patient is medically stable for D/C to [name] today. Per [facility] admissions coordinator at [facility] patient will go to room 28-A. RN will call report and arrange EMS for transport. Clinical Education officer, museum (CSW) sent D/C Summary, FL2 and D/C Packet to [employer] via [employer]. Patient is aware of above. CSW contacted patient's brother Leane Para and made him aware of above. CSW left patient's daughter Debarah Crape Long a voicemail making her aware of above. Please reconsult if future social work needs arise. CSW signing off.   [name], LCSW [phone]

## 2015-11-15 NOTE — Progress Notes (Signed)
Physical Therapy Treatment Patient Details Name: ELEFTERIOS FLETES MRN: [id number] DOB: [birth year] Today's Date: 11/15/2015    History of Present Illness Pt is a 70 y.o. male presenting with tremors/seizure like behavior and admitted with elevated troponin and UTI.  PMH includes chronic home O2, s/p crani (R frontal and temporal), s/p aneurysm clipping, CHF, htn, COPD)    PT Comments    Pt's vitals at the beginning of the session were O2 saturation: 97% O2 (4 L/min O2 via nasal cannula), HR: 81 bpm.  Pt was min assist with rolling, mod assist with sidelying to sit and min assist with sit to sidelying.  Pt required increased time and assist with trunk and LE navigation.  Pt sat EOB for 5 minutes.  Pt was observed to have increased tremors as pt sat EOB.  Pt complained of not feeling well after sitting EOB. Pt's vitals sitting EOB were O2 saturation: 87% O2 (4 L/min O2 via nasal cannula), HR: 90 bpm.  Pt was instructed in diaphragmatic breathing.  Pt was laid back in bed.  Pt's vitals at the end of the session were O2 saturation: 92% O2 (4 L/min O2 via nasal cannula), HR: 85 bpm.  Nursing was notified after session of increased tremors.  Next session PT will attempt ambulation.     Follow Up Recommendations  Supervision/Assistance - 24 hour;Home health PT     Equipment Recommendations  Rolling walker with 5" wheels    Recommendations for Other Services       Precautions / Restrictions Precautions Precautions: Fall Restrictions Weight Bearing Restrictions: No    Mobility  Bed Mobility Overal bed mobility: Needs Assistance Bed Mobility: Supine to Sit;Rolling;Sit to Sidelying Rolling: Min assist   Supine to sit: Mod assist Sit to supine: Min assist   General bed mobility comments: increased time, assist with trunk and LE navigation   Transfers                 General transfer comment: was not attempted due to tremors  Ambulation/Gait                 Stairs             Wheelchair Mobility    Modified Rankin (Stroke Patients Only)       Balance Overall balance assessment: Needs assistance Sitting-balance support: Feet supported Sitting balance-Leahy Scale: Good                              Cognition Arousal/Alertness: Awake/alert Behavior During Therapy: Impulsive Overall Cognitive Status: No family/caregiver present to determine baseline cognitive functioning       Memory: Decreased short-term memory;Decreased recall of precautions (oriented to person, did not know place, situation, year or the president )              Exercises      General Comments   Nursing was contacted and cleared pt for physical therapy.  Pt was agreeable and session was modified due to fatigue and tremors.  Nursing was notified post session of increased tremors sitting EOB.        Pertinent Vitals/Pain Pain Assessment: Faces Faces Pain Scale: Hurts little more Pain Location: head Pain Descriptors / Indicators: Constant Pain Intervention(s): Limited activity within patient's tolerance;Monitored during session;Repositioned    Home Living                      Prior Function  PT Goals (current goals can now be found in the care plan section) Acute Rehab PT Goals Patient Stated Goal: to improve independence with mobility PT Goal Formulation: With family Progress towards PT goals: Progressing toward goals    Frequency  Min 2X/week    PT Plan Current plan remains appropriate    Co-evaluation             End of Session Equipment Utilized During Treatment: Gait belt;Oxygen (4 L/min O2 via nasal cannula ) Activity Tolerance: Patient limited by fatigue;Other (comment) (limited by tremors) Patient left: in bed;with call bell/phone within reach;with bed alarm set     Time: [id number] PT Time Calculation (min) (ACUTE ONLY): 25 min  Charges:                       G Codes:      Mittie Bodo, SPT   Mittie Bodo 11/15/2015, 10:14 AM

## 2015-11-15 NOTE — Progress Notes (Signed)
EMS here to transport pt. 

## 2015-11-15 NOTE — Discharge Summary (Signed)
[place] [facility] [place] NAME: Roger Moore    MR#:  [id number]  DATE OF BIRTH:  02-25-1946  DATE OF ADMISSION:  11/12/2015 ADMITTING PHYSICIAN: Harrie Foreman, MD  DATE OF DISCHARGE: No discharge date for patient encounter.  PRIMARY CARE PHYSICIAN: Marguerita Merles, MD    ADMISSION DIAGNOSIS:  Altered mental status  DISCHARGE DIAGNOSIS:  Active Problems:   UTI (lower urinary tract infection)  metabolic encephalopathy secondary to urinary tract infection, site unspecified  SECONDARY DIAGNOSIS:   Past Medical History  Diagnosis Date  . CHF (congestive heart failure) ([place])   . Hypertension   . COPD (chronic obstructive pulmonary disease) [facility])     HOSPITAL COURSE:  Roger Moore  is a 70 y.o. male admitted 11/12/2015 with chief complaint altered mental status and weakness. Please see H&P performed by Dr. Marcille Moore for further information. Patient medically for the above findings 100 urinary tract infection on arrival he was placed on ceftriaxone for about a coverage. Symptomatically improved back to baseline urine cultures mixed results no growth, has completed 3 days with the ceftriaxone will finish a 5 day course of antibiotics him being a male. He is evaluated by physical therapy who recommended skilled nursing facility placement. Patient and family agreeable  DISCHARGE CONDITIONS:   Stable/improved  CONSULTS OBTAINED:     DRUG ALLERGIES:  No Known Allergies  DISCHARGE MEDICATIONS:   Current Discharge Medication List    START taking these medications   Details  cephALEXin (KEFLEX) 500 MG capsule Take 1 capsule (500 mg total) by mouth 2 (two) times daily. Qty: 4 capsule, Refills: 0      CONTINUE these medications which have CHANGED   Details  !! ALPRAZolam (XANAX) 0.25 MG tablet Take 1 tablet (0.25 mg total) by mouth every 6 (six) hours as needed for anxiety. Qty: 30 tablet, Refills: 0     !! - Potential duplicate  medications found. Please discuss with provider.    CONTINUE these medications which have NOT CHANGED   Details  albuterol (PROVENTIL) (2.5 MG/3ML) 0.083% nebulizer solution Take 2.5 mg by nebulization every 6 (six) hours as needed for wheezing or shortness of breath.    !! ALPRAZolam (XANAX) 0.25 MG tablet Take 0.25 mg by mouth every 6 (six) hours as needed for anxiety.    amLODipine (NORVASC) 5 MG tablet Take 1 tablet by mouth daily.    aspirin EC 81 MG tablet Take 1 tablet by mouth daily.    carbamazepine (TEGRETOL XR) 200 MG 12 hr tablet Take 200 mg by mouth 2 (two) times daily.    cetirizine (ZYRTEC) 10 MG tablet Take 1 tablet by mouth daily.    famotidine (PEPCID) 20 MG tablet Take 1 tablet by mouth 2 (two) times daily.    finasteride (PROSCAR) 5 MG tablet Take 1 tablet by mouth daily.    fluticasone (FLONASE) 50 MCG/ACT nasal spray Place 2 sprays into the nose daily.     Fluticasone-Salmeterol (ADVAIR) 250-50 MCG/DOSE AEPB Inhale into the lungs every 12 (twelve) hours.    hydrochlorothiazide (MICROZIDE) 12.5 MG capsule Take 12.5 mg by mouth daily.    hydrocortisone cream 1 % Apply topically 2 (two) times daily.    latanoprost (XALATAN) 0.005 % ophthalmic solution Apply 1 drop to eye daily.     levETIRAcetam (KEPPRA) 500 MG tablet Take 1 tablet by mouth 2 (two) times daily.    loratadine (CLARITIN) 10 MG tablet Take 10 mg by mouth daily.  lovastatin (MEVACOR) 20 MG tablet Take 1 tablet by mouth daily. With dinner    metoprolol tartrate (LOPRESSOR) 25 MG tablet Take 1 tablet by mouth 2 (two) times daily.    pantoprazole (PROTONIX) 40 MG tablet Take 1 tablet by mouth daily.    tiotropium (SPIRIVA) 18 MCG inhalation capsule Place into inhaler and inhale daily.    traZODone (DESYREL) 100 MG tablet Take 100 mg by mouth at bedtime.    zolpidem (AMBIEN) 5 MG tablet Take 5 mg by mouth at bedtime as needed for sleep.     !! - Potential duplicate medications found. Please  discuss with provider.       DISCHARGE INSTRUCTIONS:    DIET:  Cardiac diet  DISCHARGE CONDITION:  Stable  ACTIVITY:  Activity as tolerated  OXYGEN:  Home Oxygen: Yes.     Oxygen Delivery: 3 liters/min via Patient connected to nasal cannula oxygen  DISCHARGE LOCATION:  nursing home   If you experience worsening of your admission symptoms, develop shortness of breath, life threatening emergency, suicidal or homicidal thoughts you must seek medical attention immediately by calling 911 or calling your MD immediately  if symptoms less severe.  You Must read complete instructions/literature along with all the possible adverse reactions/side effects for all the Medicines you take and that have been prescribed to you. Take any new Medicines after you have completely understood and accpet all the possible adverse reactions/side effects.   Please note  You were cared for by a hospitalist during your hospital stay. If you have any questions about your discharge medications or the care you received while you were in the hospital after you are discharged, you can call the unit and asked to speak with the hospitalist on call if the hospitalist that took care of you is not available. Once you are discharged, your primary care physician will handle any further medical issues. Please note that NO REFILLS for any discharge medications will be authorized once you are discharged, as it is imperative that you return to your primary care physician (or establish a relationship with a primary care physician if you do not have one) for your aftercare needs so that they can reassess your need for medications and monitor your lab values.    On the day of Discharge:   VITAL SIGNS:  Blood pressure 133/58, pulse 81, temperature 98.8 F (37.1 C), temperature source Oral, resp. rate 18, height 5\' 5"  (1.651 m), weight 67.132 kg (148 lb), SpO2 87 %.  I/O:   Intake/Output Summary (Last 24 hours) at  11/15/15 1012 Last data filed at 11/14/15 1834  Gross per 24 hour  Intake    360 ml  Output      0 ml  Net    360 ml    PHYSICAL EXAMINATION:  GENERAL:  70 y.o.-year-old patient lying in the bed with no acute distress.  EYES: Pupils equal, round, reactive to light and accommodation. No scleral icterus. Extraocular muscles intact.  HEENT: Head atraumatic, normocephalic. Oropharynx and nasopharynx clear.  NECK:  Supple, no jugular venous distention. No thyroid enlargement, no tenderness.  LUNGS: Normal breath sounds bilaterally, no wheezing, rales,rhonchi or crepitation. No use of accessory muscles of respiration.  CARDIOVASCULAR: S1, S2 normal. No murmurs, rubs, or gallops.  ABDOMEN: Soft, non-tender, non-distended. Bowel sounds present. No organomegaly or mass.  EXTREMITIES: No pedal edema, cyanosis, or clubbing.  NEUROLOGIC: Cranial nerves II through XII are intact. Muscle strength 5/5 in all extremities. Sensation intact. Gait not checked.  PSYCHIATRIC: The patient is alert and oriented x 3.  SKIN: No obvious rash, lesion, or ulcer.   DATA REVIEW:   CBC  Recent Labs Lab 11/13/15 0544  WBC 8.3  HGB 10.6*  HCT 32.8*  PLT 194    Chemistries   Recent Labs Lab 11/12/15 2124 11/14/15 0547  NA 148* 141  K 3.5 3.1*  CL 91* 91*  CO2 50* 45*  GLUCOSE 151* 99  BUN 30* 23*  CREATININE 0.95 0.91  CALCIUM 9.0 8.1*  MG 1.7  --   AST 38  --   ALT 34  --   ALKPHOS 78  --   BILITOT 0.4  --     Cardiac Enzymes  Recent Labs Lab 11/13/15 2203  TROPONINI 0.06*    Microbiology Results  Results for orders placed or performed during the hospital encounter of 11/12/15  Urine culture     Status: None   Collection Time: 11/13/15  1:15 AM  Result Value Ref Range Status   Specimen Description URINE, RANDOM  Final   Special Requests Normal  Final   Culture MULTIPLE SPECIES PRESENT, SUGGEST RECOLLECTION  Final   Report Status 11/14/2015 FINAL  Final    RADIOLOGY:  Dg  Chest 1 View  11/14/2015  CLINICAL DATA:  Tremors and seizure EXAM: CHEST 1 VIEW COMPARISON:  11/12/2015 FINDINGS: Mild cardiac enlargement stable. Calcification and uncoiling of the aorta unchanged. Appearance of soft tissue opacity right apex is likely related patient rotation. The vascular pattern is within normal limits. There is mild diffuse interstitial process which is new from 11/10/2014. It is unchanged from 11/12/2015. There is also new hazy density in the right base. This could represent atelectasis. There is also a nodular opacity over the left lower lobe measuring about 1 cm. It is not seen on any prior studies. It is likely a nipple shadow. IMPRESSION: Mild interstitial change is likely chronic. Superimposed on this is right lower lobe opacity that could represent atelectasis or pneumonia. Consider repeating the PA view with nipple markers to confirm that nodular opacity over the left base is a nipple shadow. Electronically Signed   By: Skipper Cliche M.D.   On: 11/14/2015 15:25     Management plans discussed with the patient, family and they are in agreement.  CODE STATUS:     Code Status Orders        Start     Ordered   11/13/15 0422  Full code   Continuous     11/13/15 0421    Code Status History    Date Active Date Inactive Code Status Order ID Comments User Context   This patient has a current code status but no historical code status.      TOTAL TIME TAKING CARE OF THIS PATIENT: 33 minutes.    Roger Moore,  Roger Moore on 11/15/2015 at 10:12 AM  Between 7am to 6pm - Pager - [phone]  After 6pm go to [email or web address] - password [email or web address] [place] [facility]  Office  [phone]  CC: Primary care physician; Marguerita Merles, MD

## 2015-11-15 NOTE — Clinical Social Work Placement (Signed)
   CLINICAL SOCIAL WORK PLACEMENT  NOTE  Date:  11/15/2015  Patient Details  Name: Roger Moore MRN: [id number] Date of Birth: 1946-06-02  Clinical Social Work is seeking post-discharge placement for this patient at the [place] level of care (*CSW will initial, date and re-position this form in  chart as items are completed):  Yes   Patient/family provided with [place] Work Department's list of facilities offering this level of care within the geographic area requested by the patient (or if unable, by the patient's family).  Yes   Patient/family informed of their freedom to choose among providers that offer the needed level of care, that participate in Medicare, Medicaid or managed care program needed by the patient, have an available bed and are willing to accept the patient.  Yes   Patient/family informed of [facility]'s ownership interest in [facility] and [facility], as well as of the fact that they are under no obligation to receive care at these facilities.  PASRR submitted to EDS on 11/13/15 (Patient had an expiried PASARR )     [place] number received on 11/14/15 ([id number] )     Existing PASRR number confirmed on       FL2 transmitted to all facilities in geographic area requested by pt/family on 11/13/15     FL2 transmitted to all facilities within larger geographic area on       Patient informed that his/her managed care company has contracts with or will negotiate with certain facilities, including the following:        Yes   Patient/family informed of bed offers received.  Patient chooses bed at  [facility] )     Physician recommends and patient chooses bed at      Patient to be transferred to  [employer] ) on 11/15/15.  Patient to be transferred to facility by  [facility] )     Patient family notified on 11/15/15 of transfer.  Name of family member notified:   (Patient's brother  Leane Para is aware of D/C today. CSW left patient's daughter Debarah Crape Long a voicemail making her aware of D/C today. )     PHYSICIAN       Additional Comment:    _______________________________________________ Loralyn Freshwater, LCSW 11/15/2015, 11:06 AM

## 2015-11-15 NOTE — Progress Notes (Signed)
Spoke with Roger Moore, [facility] rep at [phone], to notify of non-emergent EMS transport.  Auth notification reference given as [id number].   Service date range good for 90 days starting 11/15/15.   Gap exception requested to determine if services can be considered at an in-network level.

## 2015-11-15 NOTE — Progress Notes (Signed)
DISCHARGE NOTE:  Report called to [name]. EMS called. Pt waiting on transportation.

## 2015-11-17 DEATH — deceased

## 2016-01-31 ENCOUNTER — Ambulatory Visit: Payer: Self-pay | Admitting: Psychiatry
